# Patient Record
Sex: Female | Born: 1970 | Race: White | Hispanic: No | Marital: Single | Smoking: Current every day smoker
Health system: Southern US, Community
[De-identification: ages and names within clinical notes are randomized; demographics above are authoritative.]

## PROBLEM LIST (undated history)

## (undated) DIAGNOSIS — F410 Panic disorder [episodic paroxysmal anxiety] without agoraphobia: Secondary | ICD-10-CM

## (undated) DIAGNOSIS — B9689 Other specified bacterial agents as the cause of diseases classified elsewhere: Secondary | ICD-10-CM

## (undated) DIAGNOSIS — F988 Other specified behavioral and emotional disorders with onset usually occurring in childhood and adolescence: Secondary | ICD-10-CM

## (undated) DIAGNOSIS — N760 Acute vaginitis: Secondary | ICD-10-CM

---

## 1999-11-30 ENCOUNTER — Other Ambulatory Visit: Admission: RE | Admit: 1999-11-30 | Discharge: 1999-11-30 | Payer: Self-pay

## 2014-05-05 ENCOUNTER — Encounter: Payer: Self-pay | Admitting: Emergency Medicine

## 2014-05-05 ENCOUNTER — Emergency Department
Admission: EM | Admit: 2014-05-05 | Discharge: 2014-05-05 | Disposition: A | Discharge status: Home / Self Care | Payer: Medicaid Other | Source: Home / Self Care | Attending: Family Medicine | Admitting: Family Medicine

## 2014-05-05 ENCOUNTER — Emergency Department (INDEPENDENT_AMBULATORY_CARE_PROVIDER_SITE_OTHER): Payer: Medicaid Other

## 2014-05-05 DIAGNOSIS — R0602 Shortness of breath: Secondary | ICD-10-CM

## 2014-05-05 DIAGNOSIS — R059 Cough, unspecified: Secondary | ICD-10-CM

## 2014-05-05 DIAGNOSIS — J209 Acute bronchitis, unspecified: Secondary | ICD-10-CM

## 2014-05-05 DIAGNOSIS — R05 Cough: Secondary | ICD-10-CM

## 2014-05-05 HISTORY — DX: Other specified behavioral and emotional disorders with onset usually occurring in childhood and adolescence: F98.8

## 2014-05-05 HISTORY — DX: Other specified bacterial agents as the cause of diseases classified elsewhere: B96.89

## 2014-05-05 HISTORY — DX: Panic disorder (episodic paroxysmal anxiety): F41.0

## 2014-05-05 HISTORY — DX: Other specified bacterial agents as the cause of diseases classified elsewhere: N76.0

## 2014-05-05 MED ORDER — ALBUTEROL SULFATE HFA 108 (90 BASE) MCG/ACT IN AERS: 2.0000 | INHALATION_SPRAY | RESPIRATORY_TRACT | Status: DC | PRN

## 2014-05-05 MED ORDER — DOXYCYCLINE HYCLATE 100 MG PO CAPS: 100.0000 mg | ORAL_CAPSULE | Freq: Two times a day (BID) | ORAL | Status: DC

## 2014-05-05 MED ORDER — PREDNISONE 20 MG PO TABS: 20.0000 mg | ORAL_TABLET | Freq: Two times a day (BID) | ORAL | Status: DC

## 2014-05-05 MED ORDER — GUAIFENESIN-CODEINE 100-10 MG/5ML PO SOLN: ORAL | Status: DC

## 2014-05-05 NOTE — ED Provider Notes (Signed)
CSN: [id number]     Arrival date & time 05/05/14  1336 History   First MD Initiated Contact with Patient 05/05/14 1357     Chief Complaint  Patient presents with  . Nasal Congestion      HPI Comments: Patient developed cold-like symptoms one week ago with nasal congestion, sore throat, and fatigue.  Three to four days ago she developed a non-productive cough with burning in her anterior chest, wheezing, and shortness of breath. She has a history of pneumonia. She continues to smoke.  The history is provided by the patient.    Past Medical History  Diagnosis Date  . ADD (attention deficit disorder)   . Panic attack   . Bacterial vaginosis    Past Surgical History  Procedure Laterality Date  . Cesarean section     History reviewed. No pertinent family history. History  Substance Use Topics  . Smoking status: Current Every Day Smoker -- 1.00 packs/day    Types: Cigarettes  . Smokeless tobacco: Never Used  . Alcohol Use: 0.5 oz/week    1 drink(s) per week     Comment: once a month   OB History   Grav Para Term Preterm Abortions TAB SAB Ect Mult Living                 Review of Systems + sore throat + hoarseness + cough No pleuritic pain but has burning in anterior chest + wheezing + nasal congestion + post-nasal drainage No sinus pain/pressure No itchy/red eyes No earache No hemoptysis No SOB No fever, + chills/sweats + nausea No vomiting No abdominal pain + diarrhea, resolved No urinary symptoms No skin rash + fatigue + myalgias + headache Used OTC meds without relief  Allergies  Zithromax and Penicillins  Home Medications   Prior to Admission medications   Medication Sig Start Date End Date Taking? Authorizing Provider  albuterol (PROVENTIL HFA;VENTOLIN HFA) 108 (90 BASE) MCG/ACT inhaler Inhale 2 puffs into the lungs every 4 (four) hours as needed for wheezing or shortness of breath. 05/05/14   Lattie Haw, MD  doxycycline (VIBRAMYCIN) 100 MG  capsule Take 1 capsule (100 mg total) by mouth 2 (two) times daily. Take with food 05/05/14   Lattie Haw, MD  guaiFENesin-codeine 100-10 MG/5ML syrup Take 10mL by mouth at bedtime as needed for cough 05/05/14   Lattie Haw, MD  predniSONE (DELTASONE) 20 MG tablet Take 1 tablet (20 mg total) by mouth 2 (two) times daily. Take with food. 05/05/14   Lattie Haw, MD   BP 144/91  Pulse 59  Temp(Src) 98.9 F (37.2 C) (Oral)  Resp 16  Ht  (1.702 m)  Wt 139 lb (63.05 kg)  BMI 21.77 kg/m2  SpO2 99%  LMP 05/05/2014 Physical Exam Nursing notes and Vital Signs reviewed. Appearance:  Patient appears healthy, stated age, and in no acute distress Eyes:  Pupils are equal, round, and reactive to light and accomodation.  Extraocular movement is intact.  Conjunctivae are not inflamed  Ears:  Canals normal.  Tympanic membranes normal.  Nose:  Mildly congested turbinates.  No sinus tenderness.    Pharynx:  Normal Neck:  Supple.   Enlarged tender posterior nodes are palpated bilaterally  Lungs:   Bilateral anterior wheezes.  Breath sounds are equal.  Heart:  Regular rate and rhythm without murmurs, rubs, or gallops.  Abdomen:  Nontender without masses or hepatosplenomegaly.  Bowel sounds are present.  No CVA or flank tenderness.  Extremities:  No edema.  No calf tenderness Skin:  No rash present.   ED Course  Procedures  none    Imaging Review Dg Chest 2 View  05/05/2014   CLINICAL DATA:  Cough.  Shortness of breath.  Chest discomfort.  EXAM: CHEST  2 VIEW  COMPARISON:  None.  FINDINGS: The heart size and mediastinal contours are within normal limits. Both lungs are clear. Pulmonary hyperinflation is noted. No evidence of pneumothorax or pleural effusion. The visualized skeletal structures are unremarkable.  IMPRESSION: Pulmonary hyperinflation, suspicious for asthma or COPD. No active lung disease.   Electronically Signed   By: Myles Rosenthal M.D.   On: 05/05/2014 14:52     MDM   1. Acute  bronchitis, unspecified organism    Begin doxycycline and prednisone burst.  Robitussin AC at bedtime.  Rx for albuterol inhaler. Take plain Mucinex (1200 mg guaifenesin) twice daily for cough and congestion.  May add Sudafed for sinus congestion.   Increase fluid intake, rest. May use Afrin nasal spray (or generic oxymetazoline) twice daily for about 5 days.  Also recommend using saline nasal spray several times daily and saline nasal irrigation (AYR is a common brand) Try warm salt water gargles for sore throat.  Stop all antihistamines for now, and other non-prescription cough/cold preparations. Follow-up with family doctor if not improving 7 to 10 days.     Lattie Haw, MD 05/10/14 1058

## 2014-05-05 NOTE — ED Notes (Signed)
C/o of sinus and chest congestion x 1 week with fever and body aches.

## 2014-05-05 NOTE — Discharge Instructions (Signed)
Take plain Mucinex (1200 mg guaifenesin) twice daily for cough and congestion.  May add Sudafed for sinus congestion.   Increase fluid intake, rest. °May use Afrin nasal spray (or generic oxymetazoline) twice daily for about 5 days.  Also recommend using saline nasal spray several times daily and saline nasal irrigation (AYR is a common brand) °Try warm salt water gargles for sore throat.  °Stop all antihistamines for now, and other non-prescription cough/cold preparations. °Follow-up with family doctor if not improving 7 to 10 days.  °

## 2015-09-28 DIAGNOSIS — F1021 Alcohol dependence, in remission: Secondary | ICD-10-CM | POA: Insufficient documentation

## 2016-01-04 LAB — HM PAP SMEAR: HM Pap smear: NEGATIVE

## 2016-07-02 ENCOUNTER — Encounter: Payer: Self-pay | Admitting: Osteopathic Medicine

## 2016-07-02 ENCOUNTER — Ambulatory Visit (INDEPENDENT_AMBULATORY_CARE_PROVIDER_SITE_OTHER): Payer: Self-pay | Admitting: Osteopathic Medicine

## 2016-07-02 VITALS — BP 122/76 | HR 99 | Ht 67.0 in | Wt 152.0 lb

## 2016-07-02 DIAGNOSIS — B9689 Other specified bacterial agents as the cause of diseases classified elsewhere: Secondary | ICD-10-CM

## 2016-07-02 DIAGNOSIS — J4520 Mild intermittent asthma, uncomplicated: Secondary | ICD-10-CM

## 2016-07-02 DIAGNOSIS — J329 Chronic sinusitis, unspecified: Secondary | ICD-10-CM

## 2016-07-02 DIAGNOSIS — Z9189 Other specified personal risk factors, not elsewhere classified: Secondary | ICD-10-CM

## 2016-07-02 DIAGNOSIS — F988 Other specified behavioral and emotional disorders with onset usually occurring in childhood and adolescence: Secondary | ICD-10-CM

## 2016-07-02 DIAGNOSIS — F419 Anxiety disorder, unspecified: Secondary | ICD-10-CM | POA: Insufficient documentation

## 2016-07-02 MED ORDER — CEFDINIR 300 MG PO CAPS: 300.0000 mg | ORAL_CAPSULE | Freq: Two times a day (BID) | ORAL | 0 refills | Status: DC

## 2016-07-02 MED ORDER — AMPHETAMINE-DEXTROAMPHETAMINE 20 MG PO TABS: 20.0000 mg | ORAL_TABLET | Freq: Two times a day (BID) | ORAL | 0 refills | Status: DC

## 2016-07-02 MED ORDER — ALBUTEROL SULFATE HFA 108 (90 BASE) MCG/ACT IN AERS: 2.0000 | INHALATION_SPRAY | RESPIRATORY_TRACT | 0 refills | Status: DC | PRN

## 2016-07-02 NOTE — Progress Notes (Signed)
HPI: Sabrina HowellsMisty T Dearman is a 45 y.o. female  who [facility] [place] today, 07/02/16,  for chief complaint of:  Chief Complaint  Patient presents with  . Establish Care    Discuss ADHD medication, sinus infection    ADHD: Previous CT [place] physician in [place], states she underwent full evaluation. No records available for this right now. Had been going to [place] [facility] but providers they're Changing and she was unhappy with her last doctor. Patient here to establish care, I see her mom. Patient states she functions much better on the ADHD medication, keeps her focus, reduces panic attacks. She also notes it helps her to stay sober, she admits to some problems with alcohol use. Previous notes from another doctor she saw most recently indicate same. Patient works as a Designer, television/film setserver/bartender. She has been active with AA in the past, would like to get seen by psychiatry formal rehabilitation by lack of insurance permits this for her.  Sinuses: Patient has been sick with sinus infection for about 3 weeks now, pain worse on the left side, some intermittent cough, patient is also smoker. Over-the-counter medications about been helpful. Patient also requests refill on albuterol  Patient is accompanied by mom who assists with history-taking.   Past medical, surgical, social and family history reviewed: Past Medical History:  Diagnosis Date  . ADD (attention deficit disorder)   . Bacterial vaginosis   . Panic attack    Past Surgical History:  Procedure Laterality Date  . CESAREAN SECTION     Social History  Substance Use Topics  . Smoking status: Current Every Day Smoker    Packs/day: 1.00    Types: Cigarettes  . Smokeless tobacco: Never Used  . Alcohol use 0.5 oz/week    1 Standard drinks or equivalent per week     Comment: once a month   Family History  Problem Relation Age of Onset  . Cancer Mother   . Hypertension Mother    . Alcohol abuse Father      Current medication list and allergy/intolerance information reviewed:   Current Outpatient Prescriptions on File Prior to Visit  Medication Sig Dispense Refill  . albuterol (PROVENTIL HFA;VENTOLIN HFA) 108 (90 BASE) MCG/ACT inhaler Inhale 2 puffs into the lungs every 4 (four) hours as needed for wheezing or shortness of breath. 1 Inhaler 0   No current facility-administered medications on file prior to visit.    Allergies  Allergen Reactions  . Zithromax [Azithromycin] Other (See Comments)    Patient reports having hallucinations and being seen in the ED.  [name]. Penicillins Other (See Comments)    Patient does not recall reactions as she has not had PCN since she was a child.      Review of Systems:  Constitutional: No recent illness  HEENT: +Sinus headache, no vision change  Cardiac: No  chest pain, No  pressure, No palpitations  Respiratory:  No  shortness of breath. +Cough  Gastrointestinal: No  abdominal pain, no change on bowel habits  Musculoskeletal: No new myalgia/arthralgia  Skin: No  Rash  Hem/Onc: No  easy bruising/bleeding, No  abnormal lumps/bumps  Neurologic: No  weakness, No  Dizziness  Psychiatric: No  concerns with depression, No  concerns with anxiety   Depression screen [facility] 07/02/2016  Decreased Interest 0  Down, Depressed, Hopeless 0  PHQ - 2 Score 0    Exam:  BP 122/76   Pulse 99   Ht 5'  7" (1.702 m)   Wt 152 lb (68.9 kg)   BMI 23.81 kg/m   Constitutional: VS see above. General Appearance: alert, well-developed, well-nourished, NAD  Eyes: Normal lids and conjunctive, non-icteric sclera  Ears, Nose, Mouth, Throat: MMM, Normal external inspection ears/nares/mouth/lips/gums.  Neck: No masses, trachea midline.   Respiratory: Normal respiratory effort. no wheeze, no rhonchi, no rales  Cardiovascular: S1/S2 normal, no murmur, no rub/gallop auscultated. RRR.   Musculoskeletal: Gait normal. Symmetric and  independent movement of all extremities  Neurological: Normal balance/coordination. No tremor.  Skin: warm, dry, intact.   Psychiatric: Normal judgment/insight. Normal mood and affect. Oriented x3.       ASSESSMENT/PLAN: Concern about alcohol use, patient seen to have good insight into this problem, continue better once ADHD/anxiety is managed appropriately. Reviewed records, patient was also hospitalized for unintentional overdose of prescription medications/alcohol in January 2017. 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 I'm okay to continue Adderall for now pending record review and urine drug screen, patient to follow-up in 2 weeks for further discussion.  Attention deficit disorder (ADD) in adult - Plan: amphetamine-dextroamphetamine (ADDERALL) 20 MG tablet, Drug Abuse Panel 10-50 No Conf, U  Mild intermittent reactive airway disease without complication - Plan: albuterol (PROVENTIL HFA;VENTOLIN HFA) 108 (90 Base) MCG/ACT inhaler  Bacterial sinusitis - Plan: cefdinir (OMNICEF) 300 MG capsule    Patient Instructions  PLAN: For ADHD: For initiation and maintenance of controlled substances, our clinic policy requires urine drug screen be done TODAY, and controlled substance contract signed and copy given to you. Will review records as well - please call 1 week prior to your next appointment to ensure we have received and reviewed these. Continuation of controlled substance prescriptions for attention deficit disorder will be continued at the discretion of your provider.  For sinus infection: antibiotics x1 week, let me know if no improvement     Visit summary with medication list and pertinent instructions was printed for patient to review. All questions at time of visit were answered - patient instructed to contact office with any additional concerns. ER/RTC precautions were reviewed with the patient. Follow-up plan:  Return in about 2 weeks (around 07/16/2016), or if symptoms worsen or fail to improve, for ADHD FOLLOW-UP - DRUG SCREEN REVIEW, RECORDS REVIEW .

## 2016-07-02 NOTE — Patient Instructions (Signed)
PLAN: For ADHD: For initiation and maintenance of controlled substances, our clinic policy requires urine drug screen be done TODAY, and controlled substance contract signed and copy given to you. Will review records as well - please call 1 week prior to your next appointment to ensure we have received and reviewed these. Continuation of controlled substance prescriptions for attention deficit disorder will be continued at the discretion of your provider.  For sinus infection: antibiotics x1 week, let me know if no improvement

## 2016-07-05 LAB — DRUG ABUSE PANEL 10-50 NO CONF, U
AMPHETAMINES (1000 ng/mL SCRN): POSITIVE — AB
BARBITURATES: NEGATIVE
BENZODIAZEPINES: NEGATIVE
COCAINE METABOLITES: NEGATIVE
MARIJUANA MET (50 NG/ML SCRN): NEGATIVE
METHADONE: NEGATIVE
METHAQUALONE: NEGATIVE
OPIATES: NEGATIVE
PHENCYCLIDINE: NEGATIVE
PROPOXYPHENE: NEGATIVE

## 2016-07-16 ENCOUNTER — Encounter: Payer: Self-pay | Admitting: Osteopathic Medicine

## 2016-07-16 ENCOUNTER — Ambulatory Visit (INDEPENDENT_AMBULATORY_CARE_PROVIDER_SITE_OTHER): Payer: Self-pay | Admitting: Osteopathic Medicine

## 2016-07-16 VITALS — BP 92/54 | HR 105 | Ht 67.0 in | Wt 151.0 lb

## 2016-07-16 DIAGNOSIS — F988 Other specified behavioral and emotional disorders with onset usually occurring in childhood and adolescence: Secondary | ICD-10-CM

## 2016-07-16 DIAGNOSIS — J329 Chronic sinusitis, unspecified: Secondary | ICD-10-CM

## 2016-07-16 DIAGNOSIS — R062 Wheezing: Secondary | ICD-10-CM

## 2016-07-16 DIAGNOSIS — B9689 Other specified bacterial agents as the cause of diseases classified elsewhere: Secondary | ICD-10-CM

## 2016-07-16 MED ORDER — ALBUTEROL SULFATE (2.5 MG/3ML) 0.083% IN NEBU: 2.5000 mg | INHALATION_SOLUTION | RESPIRATORY_TRACT | 12 refills | Status: DC | PRN

## 2016-07-16 MED ORDER — AMPHETAMINE-DEXTROAMPHETAMINE 20 MG PO TABS: 20.0000 mg | ORAL_TABLET | Freq: Two times a day (BID) | ORAL | 0 refills | Status: DC

## 2016-07-16 NOTE — Progress Notes (Signed)
HPI: Sabrina Barrera is a 45 y.o. female  who [facility] today, 07/16/16,  for chief complaint of:  Chief Complaint  Patient presents with  . Follow-up    ADD    Attention deficit: Recent UDS (+)for prescribed amphetamines.  No records available for review. Patient would like to continue current dose of medicine, is helping her function better at work, no complaints of palpitations/dramatic weight changes.  Sinus/respiratory: Treated at last visit for bacterial sinusitis, patient was unable to get the prescriptions filled, we opted for Omnicef due to penicillin allergy. Patient states this medication was USD80. Still having symptoms with occasional wheezing issues, sinus pressure    Past medical, surgical, social and family history reviewed: Patient Active Problem List   Diagnosis Date Noted  . ADD (attention deficit disorder) 07/02/2016  . Anxiety 07/02/2016  . History of drug overdose 07/02/2016  . History of alcoholism (HCC) 09/28/2015   Past Surgical History:  Procedure Laterality Date  . CESAREAN SECTION     Social History  Substance Use Topics  . Smoking status: Current Every Day Smoker    Packs/day: 1.00    Types: Cigarettes  . Smokeless tobacco: Never Used  . Alcohol use 0.5 oz/week    1 Standard drinks or equivalent per week     Comment: once a month   Family History  Problem Relation Age of Onset  . Cancer Mother   . Hypertension Mother   . Alcohol abuse Father      Current medication list and allergy/intolerance information reviewed:   Current Outpatient Prescriptions on File Prior to Visit  Medication Sig Dispense Refill  . albuterol (PROVENTIL HFA;VENTOLIN HFA) 108 (90 Base) MCG/ACT inhaler Inhale 2 puffs into the lungs every 4 (four) hours as needed for wheezing or shortness of breath. 1 Inhaler 0  . amphetamine-dextroamphetamine (ADDERALL) 20 MG tablet Take 1 tablet (20 mg total) by mouth 2 (two) times  daily. 28 tablet 0  . cefdinir (OMNICEF) 300 MG capsule Take 1 capsule (300 mg total) by mouth 2 (two) times daily. (Patient not taking: Reported on 07/16/2016) 14 capsule 0   No current facility-administered medications on file prior to visit.    Allergies  Allergen Reactions  . Zithromax [Azithromycin] Other (See Comments)    Patient reports having hallucinations and being seen in the ED.  [name]. Penicillins Other (See Comments)    Patient does not recall reactions as she has not had PCN since she was a child.      Review of Systems:  Constitutional: No recent illness  HEENT: No  headache, no vision change  Cardiac: No  chest pain, No  pressure, No palpitations  Respiratory:  No  shortness of breath.   Psychiatric: No  concerns with depression, +concerns with anxiety  Exam:  BP (!) 92/54   Pulse (!) 105   Ht 5\' 7"  (1.702 m)   Wt 151 lb (68.5 kg)   BMI 23.65 kg/m   Constitutional: VS see above. General Appearance: alert, well-developed, well-nourished, NAD  Eyes: Normal lids and conjunctive, non-icteric sclera  Ears, Nose, Mouth, Throat: MMM, Normal external inspection ears/nares/mouth/lips/gums.  Neck: No masses, trachea midline.   Respiratory: Normal respiratory effort. no wheeze, no rhonchi, no rales  Cardiovascular: S1/S2 normal, no murmur, no rub/gallop auscultated. RRR.   Musculoskeletal: Gait normal. Symmetric and independent movement of all extremities  Neurological: Normal balance/coordination. No tremor.  Skin: warm, dry, intact.   Psychiatric: Normal judgment/insight. Normal  mood and affect. Oriented x3.     ASSESSMENT/PLAN:   Good Rx card given for medications, particularly albuterol and antibiotic as well as ADHD treatment  Treatment supply of medications provided, contract is in place.  Attention deficit disorder (ADD) in adult - Plan: amphetamine-dextroamphetamine (ADDERALL) 20 MG tablet, DISCONTINUED: amphetamine-dextroamphetamine (ADDERALL)  20 MG tablet, DISCONTINUED: amphetamine-dextroamphetamine (ADDERALL) 20 MG tablet  Bacterial sinusitis  Wheezing - Plan: albuterol (PROVENTIL) (2.5 MG/3ML) 0.083% nebulizer solution      Visit summary with medication list and pertinent instructions was printed for patient to review. All questions at time of visit were answered - patient instructed to contact office with any additional concerns. ER/RTC precautions were reviewed with the patient. Follow-up plan: Return in about 3 months (around 10/16/2016) for ADHD FOLLOWUP.

## 2016-10-05 ENCOUNTER — Telehealth: Payer: Self-pay | Admitting: Osteopathic Medicine

## 2016-10-05 NOTE — Telephone Encounter (Signed)
Called and left voicemail for patient to see if they have had or would like to have their flu vaccination. - CF

## 2016-10-16 ENCOUNTER — Ambulatory Visit: Payer: Self-pay | Admitting: Osteopathic Medicine

## 2016-10-26 ENCOUNTER — Encounter: Payer: Self-pay | Admitting: Osteopathic Medicine

## 2016-10-26 ENCOUNTER — Ambulatory Visit (INDEPENDENT_AMBULATORY_CARE_PROVIDER_SITE_OTHER): Payer: Self-pay | Admitting: Osteopathic Medicine

## 2016-10-26 VITALS — BP 137/86 | HR 90 | Ht 68.0 in | Wt 156.0 lb

## 2016-10-26 DIAGNOSIS — Z0000 Encounter for general adult medical examination without abnormal findings: Secondary | ICD-10-CM

## 2016-10-26 DIAGNOSIS — F988 Other specified behavioral and emotional disorders with onset usually occurring in childhood and adolescence: Secondary | ICD-10-CM

## 2016-10-26 MED ORDER — AMPHETAMINE-DEXTROAMPHETAMINE 20 MG PO TABS: 20.0000 mg | ORAL_TABLET | Freq: Two times a day (BID) | ORAL | 0 refills | Status: DC

## 2016-10-26 NOTE — Progress Notes (Signed)
HPI: Sabrina Barrera is a 46 y.o. female  who presents to [facility] today, 10/26/16,  for chief complaint of:  Chief Complaint  Patient presents with  . Follow-up    Attention deficit: Recent UDS (+)for prescribed amphetamines, negative for illicit drugs or non-prescribed medications.  Patient would like to continue current dose of medicine, is helping her function better at work, no complaints of palpitations/dramatic weight loss.     Past medical, surgical, social and family history reviewed: Patient Active Problem List   Diagnosis Date Noted  . ADD (attention deficit disorder) 07/02/2016  . Anxiety 07/02/2016  . History of drug overdose 07/02/2016  . History of alcoholism (HCC) 09/28/2015   Past Surgical History:  Procedure Laterality Date  . CESAREAN SECTION     Social History  Substance Use Topics  . Smoking status: Current Every Day Smoker    Packs/day: 1.00    Types: Cigarettes  . Smokeless tobacco: Never Used  . Alcohol use 0.5 oz/week    1 Standard drinks or equivalent per week     Comment: once a month   Family History  Problem Relation Age of Onset  . Cancer Mother   . Hypertension Mother   . Alcohol abuse Father      Current medication list and allergy/intolerance information reviewed:   Current Outpatient Prescriptions on File Prior to Visit  Medication Sig Dispense Refill  . albuterol (PROVENTIL HFA;VENTOLIN HFA) 108 (90 Base) MCG/ACT inhaler Inhale 2 puffs into the lungs every 4 (four) hours as needed for wheezing or shortness of breath. 1 Inhaler 0  . albuterol (PROVENTIL) (2.5 MG/3ML) 0.083% nebulizer solution Take 3 mLs (2.5 mg total) by nebulization every 4 (four) hours as needed for wheezing or shortness of breath. 25 vial 12  . amphetamine-dextroamphetamine (ADDERALL) 20 MG tablet Take 1 tablet (20 mg total) by mouth 2 (two) times daily. 60 tablet 0  . cefdinir (OMNICEF) 300 MG capsule Take 1 capsule (300 mg  total) by mouth 2 (two) times daily. (Patient not taking: Reported on 07/16/2016) 14 capsule 0   No current facility-administered medications on file prior to visit.    Allergies  Allergen Reactions  . Zithromax [Azithromycin] Other (See Comments)    Patient reports having hallucinations and being seen in the ED.  [name] Penicillins Other (See Comments)    Patient does not recall reactions as she has not had PCN since she was a child.      Review of Systems:  Constitutional: +recent illness w/ flu but better now  HEENT: No  headache, no vision change  Cardiac: No  chest pain, No  pressure, No palpitations  Respiratory:  No  shortness of breath.    Exam:  BP 137/86   Pulse 90   Ht 5\' 8"  (1.727 m)   Wt 156 lb (70.8 kg)   BMI 23.72 kg/m   Constitutional: VS see above. General Appearance: alert, well-developed, well-nourished, NAD  Eyes: Normal lids and conjunctive, non-icteric sclera  Ears, Nose, Mouth, Throat: MMM, Normal external inspection ears/nares/mouth/lips/gums.  Neck: No masses, trachea midline.   Respiratory: Normal respiratory effort. no wheeze, no rhonchi, no rales  Cardiovascular: S1/S2 normal, no murmur, no rub/gallop auscultated. RRR.   Musculoskeletal: Gait normal. Symmetric and independent movement of all extremities  Neurological: Normal balance/coordination. No tremor.  Skin: warm, dry, intact.   Psychiatric: Normal judgment/insight. Normal mood and affect. Oriented x3.     ASSESSMENT/PLAN:   3 mos supply Rx  provided, contract is in place.  Labs ordered ahead of time for annual but this was not done or billed today   Attention deficit disorder (ADD) in adult - Plan: amphetamine-dextroamphetamine (ADDERALL) 20 MG tablet, DISCONTINUED: amphetamine-dextroamphetamine (ADDERALL) 20 MG tablet, DISCONTINUED: amphetamine-dextroamphetamine (ADDERALL) 20 MG tablet  Annual physical exam - Plan: CBC with Differential/Platelet, COMPLETE METABOLIC PANEL WITH  GFR, Lipid panel, TSH, VITAMIN D 25 Hydroxy (Vit-D Deficiency, Fractures)      Visit summary with medication list and pertinent instructions was printed for patient to review. All questions at time of visit were answered - patient instructed to contact office with any additional concerns. ER/RTC precautions were reviewed with the patient. Follow-up plan: Return in about 3 months (around 01/25/2017) [facility] ADHD MEDICATION REFILL .

## 2017-01-25 ENCOUNTER — Ambulatory Visit (INDEPENDENT_AMBULATORY_CARE_PROVIDER_SITE_OTHER): Payer: Self-pay | Admitting: Osteopathic Medicine

## 2017-01-25 ENCOUNTER — Encounter: Payer: Self-pay | Admitting: Osteopathic Medicine

## 2017-01-25 VITALS — BP 112/80 | HR 90 | Ht 67.0 in | Wt 146.0 lb

## 2017-01-25 DIAGNOSIS — Z0000 Encounter for general adult medical examination without abnormal findings: Secondary | ICD-10-CM

## 2017-01-25 DIAGNOSIS — F988 Other specified behavioral and emotional disorders with onset usually occurring in childhood and adolescence: Secondary | ICD-10-CM

## 2017-01-25 DIAGNOSIS — N760 Acute vaginitis: Secondary | ICD-10-CM

## 2017-01-25 MED ORDER — AMPHETAMINE-DEXTROAMPHETAMINE 20 MG PO TABS: 20.0000 mg | ORAL_TABLET | Freq: Two times a day (BID) | ORAL | 0 refills | Status: DC

## 2017-01-25 MED ORDER — BORIC ACID CRYS: CRYSTALS | 1 refills | Status: DC

## 2017-01-25 MED ORDER — FLUCONAZOLE 150 MG PO TABS: 150.0000 mg | ORAL_TABLET | Freq: Once | ORAL | 1 refills | Status: AC

## 2017-01-25 NOTE — Progress Notes (Signed)
HPI: Sabrina Barrera is a 46 y.o. female  who presents to [facility] [place] today, 01/25/17,  for chief complaint of:  Chief Complaint  Patient presents with  . Annual Exam     Patient here for annual physical / wellness exam.  See preventive care reviewed as below.  Recent labs reviewed in detail with the patient.   Additional concerns today include:  ADHD: doing well on current medication regimen.     Past medical, surgical, social and family history reviewed: Patient Active Problem List   Diagnosis Date Noted  . ADD (attention deficit disorder) 07/02/2016  . Anxiety 07/02/2016  . History of drug overdose 07/02/2016  . History of alcoholism (HCC) 09/28/2015   Past Surgical History:  Procedure Laterality Date  . CESAREAN SECTION     Social History  Substance Use Topics  . Smoking status: Current Every Day Smoker    Packs/day: 1.00    Types: Cigarettes  . Smokeless tobacco: Never Used  . Alcohol use 0.5 oz/week    1 Standard drinks or equivalent per week     Comment: once a month   Family History  Problem Relation Age of Onset  . Cancer Mother   . Hypertension Mother   . Alcohol abuse Father      Current medication list and allergy/intolerance information reviewed:   Current Outpatient Prescriptions  Medication Sig Dispense Refill  . albuterol (PROVENTIL HFA;VENTOLIN HFA) 108 (90 Base) MCG/ACT inhaler Inhale 2 puffs into the lungs every 4 (four) hours as needed for wheezing or shortness of breath. 1 Inhaler 0  . albuterol (PROVENTIL) (2.5 MG/3ML) 0.083% nebulizer solution Take 3 mLs (2.5 mg total) by nebulization every 4 (four) hours as needed for wheezing or shortness of breath. 25 vial 12  . amphetamine-dextroamphetamine (ADDERALL) 20 MG tablet Take 1 tablet (20 mg total) by mouth 2 (two) times daily. 60 tablet 0   No current facility-administered medications for this visit.    Allergies  Allergen Reactions  . Zithromax  [Azithromycin] Other (See Comments)    Patient reports having hallucinations and being seen in the ED.  [name] Penicillins Other (See Comments)    Patient does not recall reactions as she has not had PCN since she was a child.      Review of Systems:  Constitutional:  No  fever, no chills, No recent illness, No unintentional weight changes. No significant fatigue.   HEENT: No  headache, no vision change  Cardiac: No  chest pain, No  pressure  Respiratory:  No  shortness of breath. No  Cough  Gastrointestinal: No  abdominal pain, No  nausea   Musculoskeletal: No new myalgia/arthralgia  Skin: No  Rash, No other wounds/concerning lesions  Hem/Onc: No  easy bruising/bleeding,   Endocrine: No cold intolerance,  No heat intolerance. No polyuria/polydipsia/polyphagia   GU: vaginal discharge, Hx BV  Neurologic: No  weakness, No  dizziness, No  slurred speech/focal weakness/facial droop  Psychiatric: No  concerns with depression, No  concerns with anxiety, No sleep problems, No mood problems  Exam:  BP 112/80   Pulse 90   Ht 5\' 7"  (1.702 m)   Wt 146 lb (66.2 kg)   BMI 22.87 kg/m   Constitutional: VS see above. General Appearance: alert, well-developed, well-nourished, NAD  Eyes: Normal lids and conjunctive, non-icteric sclera  Ears, Nose, Mouth, Throat: MMM, Normal external inspection ears/nares/mouth/lips/gums.   Neck: No masses, trachea midline. No thyroid enlargement. No tenderness/mass appreciated. No lymphadenopathy  Respiratory: Normal respiratory effort. no wheeze, no rhonchi, no rales  Cardiovascular: S1/S2 normal, no murmur, no rub/gallop auscultated. RRR. No lower extremity edema.  Gastrointestinal: Nontender, no masses. No hepatomegaly, no splenomegaly. No hernia appreciated. Bowel sounds normal. Rectal exam deferred.   Musculoskeletal: Gait normal. No clubbing/cyanosis of digits.   Neurological: Normal balance/coordination. No tremor.  Skin: warm, dry, intact.  No rash/ulcer. No concerning nevi or subq nodules on limited exam.    Psychiatric: Normal judgment/insight. Normal mood and affect. Oriented x3.    ASSESSMENT/PLAN:   Annual physical exam  Attention deficit disorder (ADD) in adult - Plan: amphetamine-dextroamphetamine (ADDERALL) 20 MG tablet, DISCONTINUED: amphetamine-dextroamphetamine (ADDERALL) 20 MG tablet, DISCONTINUED: amphetamine-dextroamphetamine (ADDERALL) 20 MG tablet  Vaginosis - Plan: Boric Acid CRYS, fluconazole (DIFLUCAN) 150 MG tablet   FEMALE PREVENTIVE CARE Updated 01/25/17   ANNUAL SCREENING/COUNSELING  Diet/Exercise - HEALTHY HABITS DISCUSSED TO DECREASE CV RISK History  Smoking Status  . Current Every Day Smoker  . Packs/day: 1.00  . Types: Cigarettes  Smokeless Tobacco  . Never Used   History  Alcohol Use  . 0.5 oz/week  . 1 Standard drinks or equivalent per week    Comment: once a month   [facility]  Decreased Interest 0  Down, Depressed, Hopeless 0  PHQ - 2 Score 0    Domestic violence concerns - no  HTN SCREENING - SEE VITALS  SEXUAL HEALTH  Sexually active in the past year - No  Need/want STI testing today? - no  Concerns about libido or pain with sex? - no  Plans for pregnancy? - none  INFECTIOUS DISEASE SCREENING  HIV - does not need - declined  GC/CT - does not need  HepC - DOB [birth year] - does not need  TB - does not need  DISEASE SCREENING  Lipid - needs  DM2 - needs  Osteoporosis - women age 79+ - does not need  CANCER SCREENING  Cervical - does not need - Pap normal 12/2015  Breast - needs  Lung - does not need  Colon - does not need  ADULT VACCINATION  Influenza - annual vaccine recommended  Td - booster every 10 years   Zoster - option at 50, yes at 60+   PCV13 - was not indicated  PPSV23 - was not indicated  There is no immunization history on file for this patient.     Visit summary with medication list and  pertinent instructions was printed for patient to review. All questions at time of visit were answered - patient instructed to contact office with any additional concerns. ER/RTC precautions were reviewed with the patient. Follow-up plan: Return in about 3 months (around 04/27/2017) for ADHD MEDICATION REFILL. Get labs done prior to next visit

## 2017-03-27 ENCOUNTER — Ambulatory Visit: Payer: Self-pay | Admitting: Osteopathic Medicine

## 2017-03-27 DIAGNOSIS — Z0189 Encounter for other specified special examinations: Secondary | ICD-10-CM

## 2017-04-01 ENCOUNTER — Ambulatory Visit: Payer: Self-pay | Admitting: Osteopathic Medicine

## 2017-04-01 DIAGNOSIS — Z0189 Encounter for other specified special examinations: Secondary | ICD-10-CM

## 2017-04-19 ENCOUNTER — Ambulatory Visit: Payer: Self-pay

## 2017-05-08 ENCOUNTER — Ambulatory Visit: Payer: Self-pay | Admitting: Osteopathic Medicine

## 2017-05-08 DIAGNOSIS — Z0189 Encounter for other specified special examinations: Secondary | ICD-10-CM

## 2017-06-03 ENCOUNTER — Telehealth: Payer: Self-pay | Admitting: Osteopathic Medicine

## 2017-06-03 DIAGNOSIS — F988 Other specified behavioral and emotional disorders with onset usually occurring in childhood and adolescence: Secondary | ICD-10-CM

## 2017-06-03 MED ORDER — AMPHETAMINE-DEXTROAMPHETAMINE 20 MG PO TABS: 20.0000 mg | ORAL_TABLET | Freq: Two times a day (BID) | ORAL | 0 refills | Status: DC

## 2017-06-03 NOTE — Telephone Encounter (Signed)
Patient's mother called request to get a refill for Adderall scheduled appt for 06/10/17 when Dorene Grebe returns pt's mom request to call her number when ready to pick up [phone]

## 2017-06-03 NOTE — Telephone Encounter (Signed)
Prescription available for pick-up Patient needs to keep her follow-up appointment with Dorene Grebe

## 2017-06-04 NOTE — Telephone Encounter (Signed)
Patient has appointment for 06/10/2017. Rhonda Cunningham,CMA

## 2017-06-10 ENCOUNTER — Ambulatory Visit: Payer: Self-pay | Admitting: Osteopathic Medicine

## 2017-06-10 DIAGNOSIS — Z0189 Encounter for other specified special examinations: Secondary | ICD-10-CM

## 2017-06-12 ENCOUNTER — Ambulatory Visit: Payer: Self-pay | Admitting: Osteopathic Medicine

## 2017-07-09 ENCOUNTER — Ambulatory Visit (INDEPENDENT_AMBULATORY_CARE_PROVIDER_SITE_OTHER): Payer: Self-pay | Admitting: Osteopathic Medicine

## 2017-07-09 VITALS — BP 143/87 | HR 79 | Wt 164.0 lb

## 2017-07-09 DIAGNOSIS — F988 Other specified behavioral and emotional disorders with onset usually occurring in childhood and adolescence: Secondary | ICD-10-CM

## 2017-07-09 MED ORDER — AMPHETAMINE-DEXTROAMPHETAMINE 30 MG PO TABS: 30.0000 mg | ORAL_TABLET | Freq: Two times a day (BID) | ORAL | 0 refills | Status: DC

## 2017-07-09 MED ORDER — AMPHETAMINE-DEXTROAMPHETAMINE 20 MG PO TABS: 20.0000 mg | ORAL_TABLET | Freq: Two times a day (BID) | ORAL | 0 refills | Status: DC

## 2017-07-09 MED ORDER — FLUCONAZOLE 150 MG PO TABS: 150.0000 mg | ORAL_TABLET | Freq: Once | ORAL | 1 refills | Status: AC

## 2017-07-09 NOTE — Progress Notes (Signed)
HPI: Sabrina Barrera is a 46 y.o. female  who [facility], 07/09/17,  for chief complaint of:  Chief Complaint  Patient presents with  . Follow-up    ADHD    Attention deficit: Has missed several appointments - same-day cancellations per patient Patient would like to try increased dose of medicine - feels it's not working as well as it used to, is overall helping her function better at work, no complaints of palpitations or weight loss. 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     Past medical, surgical, social and family history reviewed: Patient Active Problem List   Diagnosis Date Noted  . ADD (attention deficit disorder) 07/02/2016  . Anxiety 07/02/2016  . History of drug overdose 07/02/2016  . History of alcoholism (HCC) 09/28/2015   Past Surgical History:  Procedure Laterality Date  . CESAREAN SECTION     Social History   Tobacco Use  . Smoking status: Current Every Day Smoker    Packs/day: 1.00    Types: Cigarettes  . Smokeless tobacco: Never Used  Substance Use Topics  . Alcohol use: Yes    Alcohol/week: 0.5 oz    Types: 1 Standard drinks or equivalent per week    Comment: once a month   Family History  Problem Relation Age of Onset  . Cancer Mother   . Hypertension Mother   . Alcohol abuse Father      Current medication list and allergy/intolerance information reviewed:   Current Outpatient Medications on File Prior to Visit  Medication Sig Dispense Refill  . amphetamine-dextroamphetamine (ADDERALL) 20 MG tablet Take 1 tablet (20 mg total) by mouth 2 (two) times daily. 60 tablet 0   No current facility-administered medications on file prior to visit.    Allergies  Allergen Reactions  . Zithromax [Azithromycin] Other (See Comments)    Patient reports having hallucinations and being seen in the ED.  [name]. Penicillins Other (See Comments)    Patient  does not recall reactions as she has not had PCN since she was a child.      Review of Systems:  Constitutional: +recent illness w/ flu but better now  HEENT: No  headache, no vision change  Cardiac: No  chest pain, No  pressure, No palpitations  Respiratory:  No  shortness of breath.    Exam:  BP (!) 143/87   Pulse 79   Wt 164 lb (74.4 kg)   BMI 25.69 kg/m   Constitutional: VS see above. General Appearance: alert, well-developed, well-nourished, NAD  Eyes: Normal lids and conjunctive, non-icteric sclera  Ears, Nose, Mouth, Throat: MMM, Normal external inspection ears/nares/mouth/lips/gums.  Neck: No masses, trachea midline.   Respiratory: Normal respiratory effort. no wheeze, no rhonchi, no rales  Cardiovascular: S1/S2 normal, no murmur, no rub/gallop auscultated. RRR.   Musculoskeletal: Gait normal. Symmetric and independent movement of all extremities  Neurological: Normal balance/coordination. No tremor.  Skin: warm, dry, intact.   Psychiatric: Normal judgment/insight. Normal mood and affect. Oriented x3.     ASSESSMENT/PLAN: The encounter diagnosis was Attention deficit disorder (ADD) in adult.   3 mos supply Rx provided, we increased dose to 30 mg bid, if this isn't helping will likely need to switch drugs altogether since this is max dose        Visit summary with medication list and pertinent instructions was printed for patient to review. All  questions at time of visit were answered - patient instructed to contact office with any additional concerns. ER/RTC precautions were reviewed with the patient. Follow-up plan: Return in about 3 months (around 10/09/2017) for refill and recheck on ADD medications .

## 2017-07-10 MED ORDER — SODIUM CHLORIDE 0.9 % IV SOLN
INTRAVENOUS | Status: DC
Start: ? — End: 2017-07-10

## 2017-07-10 MED ORDER — GENERIC EXTERNAL MEDICATION
Status: DC
Start: ? — End: 2017-07-10

## 2017-10-03 ENCOUNTER — Ambulatory Visit (INDEPENDENT_AMBULATORY_CARE_PROVIDER_SITE_OTHER): Payer: Medicaid Other | Admitting: Osteopathic Medicine

## 2017-10-03 ENCOUNTER — Encounter: Payer: Self-pay | Admitting: Osteopathic Medicine

## 2017-10-03 VITALS — BP 137/78 | HR 85 | Temp 98.2°F | Wt 167.0 lb

## 2017-10-03 DIAGNOSIS — F988 Other specified behavioral and emotional disorders with onset usually occurring in childhood and adolescence: Secondary | ICD-10-CM

## 2017-10-03 DIAGNOSIS — F341 Dysthymic disorder: Secondary | ICD-10-CM

## 2017-10-03 MED ORDER — AMPHETAMINE-DEXTROAMPHETAMINE 30 MG PO TABS: 30.0000 mg | ORAL_TABLET | Freq: Two times a day (BID) | ORAL | 0 refills | Status: DC

## 2017-10-03 MED ORDER — BUPROPION HCL ER (XL) 150 MG PO TB24: 150.0000 mg | ORAL_TABLET | Freq: Every day | ORAL | 1 refills | Status: DC

## 2017-10-03 NOTE — Patient Instructions (Addendum)
Weight loss: important things to remember  It is hard work! You will have setbacks, but don't get discouraged. The goal is not short-term success, it is long-term health.   Looking at the numbers is important to track your progress and set goals, but how you are feeling and your overall health are the most important things! BMI and pounds and calories and miles logged aren't everything - they are tools to help us reach your goals.  You can do this!!!   Things to remember for exercise for weight loss:   Please note - I am not a certified personal trainer. I can present you with ideas and general workout goals, but an exercise program is largely up to you. Find something you can stick with, and something you enjoy!   As you progress in your exercise regimen think about gradually increasing the following, week by week:   intensity (how strenuous is your workout)  frequency (how often you are exercising)  duration (how many minutes at a time you are exercising)  Walking for 20 minutes a day is certainly better than nothing, but more strenuous exercise will develop better cardiovascular fitness.   interval training (high-intensity alternating with low-intensity, think walk/jog rather than just walk)  muscle strengthening exercises (weight lifting, calisthenics, yoga) - this also helps prevent osteoporosis!   Things to remember for diet changes for weight loss:   Please note - I am not a certified dietician. I can present you with ideas and general diet goals, but a meal plan is largely up to you. I am happy to refer you to a dietician who can give you a detailed meal plan.  Apps/logs are crucial to track how you're eating! It's not realistic to be logging everything you eat forever, but when you're starting a healthy eating lifestyle it's very helpful, and checking in with logs now and then helps you stick to your program!   Calorie restriction with the goal weight loss of no more than one  to one and a half pounds per week.   Increase lean protein such as chicken, fish, [place].   Decrease fatty foods such as dairy, butter.   Decrease sugary foods. Avoid sugary drinks such as soda or juice.  Increase fiber found in fruit and vegetables.    Medications approved for long-term use for obesity  Saxenda (Liraglutide 3 mg/day) injectable   Contrave (Bupropion and Naltrexone)  Lorcaserin (Belviq or Belviq XR)  Orlistat (Xenical, Alli over-the-counter)  Bupropion (Wellbutrin) I recommend that you research the above medications and see which one(s) your insurance may or may not cover: If you call your insurance, ask them specifically what medications are on their formulary that are approved for obesity treatment. They should be able to send you a list or tell you over the phone. Remember, medications aren't magic! You MUST be diligent about lifestyle changes as well!

## 2017-10-03 NOTE — Progress Notes (Signed)
HPI: Sabrina Barrera is a 47 y.o. female  who presents to [facility] today, 10/03/17,  for chief complaint of:  ADHD follow-up Weight gain   Medication is overall helping her function better at work, no complaints of palpitations or weight loss, in fact has gained weight over the past year. 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  3 mos supply Rx provided at last visit, we increased dose to 30 mg bid, if this wasn't helping, we discussed there would be a need to switch drugs altogether since this is max dose  She states her attention is not a particular issue for her. Weight gain of about 30 pounds over the past year is a bit distressing. She is not able to drive, she has been cooped up inside, depression means that she has been eating a bit more lately.   Past medical, surgical, social and family history reviewed: Patient Active Problem List   Diagnosis Date Noted  . ADD (attention deficit disorder) 07/02/2016  . Anxiety 07/02/2016  . History of drug overdose 07/02/2016  . History of alcoholism (HCC) 09/28/2015   Past Surgical History:  Procedure Laterality Date  . CESAREAN SECTION     Social History   Tobacco Use  . Smoking status: Current Every Day Smoker    Packs/day: 1.00    Types: Cigarettes  . Smokeless tobacco: Never Used  Substance Use Topics  . Alcohol use: Yes    Alcohol/week: 0.5 oz    Types: 1 Standard drinks or equivalent per week    Comment: once a month   Family History  Problem Relation Age of Onset  . Cancer Mother   . Hypertension Mother   . Alcohol abuse Father      Current medication list and allergy/intolerance information reviewed:   Current Outpatient Medications on File Prior to Visit  Medication Sig Dispense Refill  . amphetamine-dextroamphetamine (ADDERALL) 30 MG tablet Take 1 tablet 2 (two) times daily by mouth. 60 tablet 0   No current  facility-administered medications on file prior to visit.    Allergies  Allergen Reactions  . Zithromax [Azithromycin] Other (See Comments)    Patient reports having hallucinations and being seen in the ED.  [name] Penicillins Other (See Comments)    Patient does not recall reactions as she has not had PCN since she was a child.      Review of Systems:  Constitutional: +recent illness w/ flu but better now  HEENT: No  headache, no vision change  Cardiac: No  chest pain, No  pressure, No palpitations  Respiratory:  No  shortness of breath.    Exam:  BP 137/78   Pulse 85   Temp 98.2 F (36.8 C) (Oral)   Wt 167 lb 0.6 oz (75.8 kg)   BMI 26.16 kg/m   Constitutional: VS see above. General Appearance: alert, well-developed, well-nourished, NAD  Eyes: Normal lids and conjunctive, non-icteric sclera  Ears, Nose, Mouth, Throat: MMM, Normal external inspection ears/nares/mouth/lips/gums.  Neck: No masses, trachea midline.   Respiratory: Normal respiratory effort. no wheeze, no rhonchi, no rales  Cardiovascular: S1/S2 normal, no murmur, no rub/gallop auscultated. RRR.   Musculoskeletal: Gait normal. Symmetric and independent movement of all extremities  Neurological: Normal balance/coordination. No tremor.  Skin: warm, dry, intact.   Psychiatric: Normal judgment/insight. Normal mood and affect. Oriented x3.     ASSESSMENT/PLAN: The primary encounter diagnosis  was Attention deficit disorder (ADD) in adult. A diagnosis of Dysthymia was also pertinent to this visit.   Patient prefers printed prescriptions for ADHD medication, 3 prescriptions were given to her at this visit.   Wellbutrin would be a good option to help treat mood/depression as well as augment ADHD treatment and hopefully help as well with weight loss. She is interested in other weight loss medications, list was given to her once I think might be helpful for her and she is to call her insurance company to see which  may be covered. We had a discussion on lifestyle modifications as well. See printed patient instructions on AVS  Meds ordered this encounter  Medications  . buPROPion (WELLBUTRIN XL) 150 MG 24 hr tablet    Sig: Take 1 tablet (150 mg total) by mouth daily.    Dispense:  90 tablet    Refill:  1  . DISCONTD: amphetamine-dextroamphetamine (ADDERALL) 30 MG tablet    Sig: Take 1 tablet by mouth 2 (two) times daily.    Dispense:  60 tablet    Refill:  0  . DISCONTD: amphetamine-dextroamphetamine (ADDERALL) 30 MG tablet    Sig: Take 1 tablet by mouth 2 (two) times daily.    Dispense:  60 tablet    Refill:  0  . amphetamine-dextroamphetamine (ADDERALL) 30 MG tablet    Sig: Take 1 tablet by mouth 2 (two) times daily.    Dispense:  60 tablet    Refill:  0          Visit summary with medication list and pertinent instructions was printed for patient to review. All questions at time of visit were answered - patient instructed to contact office with any additional concerns. ER/RTC precautions were reviewed with the patient. Follow-up plan: Return in about 3 months (around 12/31/2017) for refill ADHD medications, sooner if needed .

## 2017-12-31 ENCOUNTER — Ambulatory Visit: Payer: Medicaid Other | Admitting: Osteopathic Medicine

## 2018-01-01 ENCOUNTER — Other Ambulatory Visit: Payer: Self-pay

## 2018-01-01 DIAGNOSIS — F988 Other specified behavioral and emotional disorders with onset usually occurring in childhood and adolescence: Secondary | ICD-10-CM

## 2018-01-01 NOTE — Telephone Encounter (Signed)
Sabrina Barrera called for a refill of Adderall. She was rescheduled for follow up to next week.

## 2018-01-02 MED ORDER — AMPHETAMINE-DEXTROAMPHETAMINE 30 MG PO TABS: 30.0000 mg | ORAL_TABLET | Freq: Two times a day (BID) | ORAL | 0 refills | Status: DC

## 2018-01-08 ENCOUNTER — Ambulatory Visit: Payer: Medicaid Other | Admitting: Osteopathic Medicine

## 2018-01-14 ENCOUNTER — Ambulatory Visit (INDEPENDENT_AMBULATORY_CARE_PROVIDER_SITE_OTHER): Payer: Self-pay | Admitting: Osteopathic Medicine

## 2018-01-14 ENCOUNTER — Encounter: Payer: Self-pay | Admitting: Osteopathic Medicine

## 2018-01-14 VITALS — BP 138/89 | HR 70 | Temp 98.3°F | Wt 157.4 lb

## 2018-01-14 DIAGNOSIS — X503XXA Overexertion from repetitive movements, initial encounter: Secondary | ICD-10-CM

## 2018-01-14 DIAGNOSIS — F988 Other specified behavioral and emotional disorders with onset usually occurring in childhood and adolescence: Secondary | ICD-10-CM

## 2018-01-14 DIAGNOSIS — S39012A Strain of muscle, fascia and tendon of lower back, initial encounter: Secondary | ICD-10-CM

## 2018-01-14 MED ORDER — AMPHETAMINE-DEXTROAMPHETAMINE 30 MG PO TABS: 30.0000 mg | ORAL_TABLET | Freq: Two times a day (BID) | ORAL | 0 refills | Status: DC

## 2018-01-14 NOTE — Patient Instructions (Signed)
TENS unit - look it up on [place]!  Try OTC ibuprofen or aleve as needed

## 2018-01-14 NOTE — Progress Notes (Signed)
HPI: Sabrina Barrera is a 47 y.o. female  who presents to [facility] [place] today, 01/14/18,  for chief complaint of:  ADHD follow-up Weight gain   Medication is overall helping her function better at work, no complaints of palpitations or weight loss. 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 No injury but thinks at some point she may have twisted and felt popping sensation.   3 mos supply Rx provided today, pt requests printed Rx      Past medical, surgical, social and family history reviewed: Patient Active Problem List   Diagnosis Date Noted  . ADD (attention deficit disorder) 07/02/2016  . Anxiety 07/02/2016  . History of drug overdose 07/02/2016  . History of alcoholism (HCC) 09/28/2015   Past Surgical History:  Procedure Laterality Date  . CESAREAN SECTION     Social History   Tobacco Use  . Smoking status: Current Every Day Smoker    Packs/day: 1.00    Types: Cigarettes  . Smokeless tobacco: Never Used  Substance Use Topics  . Alcohol use: Yes    Alcohol/week: 0.5 oz    Types: 1 Standard drinks or equivalent per week    Comment: once a month   Family History  Problem Relation Age of Onset  . Cancer Mother   . Hypertension Mother   . Alcohol abuse Father      Current medication list and allergy/intolerance information reviewed:   Current Outpatient Medications on File Prior to Visit  Medication Sig Dispense Refill  . amphetamine-dextroamphetamine (ADDERALL) 30 MG tablet Take 1 tablet by mouth 2 (two) times daily. 60 tablet 0  . buPROPion (WELLBUTRIN XL) 150 MG 24 hr tablet Take 1 tablet (150 mg total) by mouth daily. 90 tablet 1   No current facility-administered medications on file prior to visit.    Allergies  Allergen Reactions  . Azithromycin Other (See Comments)    Patient reports having hallucinations and  being seen in the ED.  [name] Penicillins Other (See Comments) and Rash    Patient does not recall reactions as she has not had PCN since she was a child.      Review of Systems:  Constitutional: No fever/chills   HEENT: No  headache, no vision change  Cardiac: No  chest pain, No  pressure, No palpitations  MSK: see hpi   Respiratory:  No  shortness of breath.    Exam:  BP 138/89 (BP Location: Left Arm, Patient Position: Sitting, Cuff Size: Normal)   Pulse 70   Temp 98.3 F (36.8 C) (Oral)   Wt 157 lb 6.4 oz (71.4 kg)   BMI 24.65 kg/m   Constitutional: VS see above. General Appearance: alert, well-developed, well-nourished, NAD  Eyes: Normal lids and conjunctive, non-icteric sclera  Ears, Nose, Mouth, Throat: MMM, Normal external inspection ears/nares/mouth/lips/gums.  Neck: No masses, trachea midline.   Respiratory: Normal respiratory effort. no wheeze, no rhonchi, no rales  Cardiovascular: S1/S2 normal, no murmur, no rub/gallop auscultated. RRR.   Musculoskeletal: Gait normal. Symmetric and independent movement of all extremities. (+)paraspinal tendnerness Ql/lumbar area on R, worse w/ forward flexion, no change w/ L/R rotation or SB, neg SLR  Neurological: Normal balance/coordination. No tremor.  Skin: warm, dry, intact.   Psychiatric: Normal judgment/insight. Normal mood and affect. Oriented x3.     ASSESSMENT/PLAN: The primary encounter diagnosis was Repetitive strain injury  of lower back, initial encounter. A diagnosis of Attention deficit disorder (ADD) in adult was also pertinent to this visit.   Patient prefers printed prescriptions for ADHD medication, 3 prescriptions were given to her at this visit.   Wellbutrin would be a good option to help treat mood/depression as well as augment ADHD treatment, she hasnt filled this Rx yet, encouraged to do so if desired or let me know to take it off list next visit  MSK back strain, advised strengthening exercises,  offered formal PT  Meds ordered this encounter  Medications  . DISCONTD: amphetamine-dextroamphetamine (ADDERALL) 30 MG tablet    Sig: Take 1 tablet by mouth 2 (two) times daily.    Dispense:  60 tablet    Refill:  0  . DISCONTD: amphetamine-dextroamphetamine (ADDERALL) 30 MG tablet    Sig: Take 1 tablet by mouth 2 (two) times daily.    Dispense:  60 tablet    Refill:  0  . amphetamine-dextroamphetamine (ADDERALL) 30 MG tablet    Sig: Take 1 tablet by mouth 2 (two) times daily.    Dispense:  60 tablet    Refill:  0      Total time spent 15 minutes, greater than 50% of the visit was counseling and coordinating care for diagnosis of The primary encounter diagnosis was Repetitive strain injury of lower back, initial encounter. A diagnosis of Attention deficit disorder (ADD) in adult was also pertinent to this visit. .     Visit summary with medication list and pertinent instructions was printed for patient to review. All questions at time of visit were answered - patient instructed to contact office with any additional concerns. ER/RTC precautions were reviewed with the patient.   Follow-up plan: Return for when due for med refills (early 04/2018).

## 2018-04-25 ENCOUNTER — Ambulatory Visit: Payer: Medicaid Other | Admitting: Osteopathic Medicine

## 2018-05-12 ENCOUNTER — Ambulatory Visit (INDEPENDENT_AMBULATORY_CARE_PROVIDER_SITE_OTHER): Payer: Medicaid Other | Admitting: Osteopathic Medicine

## 2018-05-12 ENCOUNTER — Encounter: Payer: Self-pay | Admitting: Osteopathic Medicine

## 2018-05-12 VITALS — BP 130/82 | HR 107 | Temp 98.6°F | Wt 157.3 lb

## 2018-05-12 DIAGNOSIS — F988 Other specified behavioral and emotional disorders with onset usually occurring in childhood and adolescence: Secondary | ICD-10-CM

## 2018-05-12 DIAGNOSIS — F341 Dysthymic disorder: Secondary | ICD-10-CM

## 2018-05-12 DIAGNOSIS — M7702 Medial epicondylitis, left elbow: Secondary | ICD-10-CM

## 2018-05-12 MED ORDER — AMPHETAMINE-DEXTROAMPHETAMINE 30 MG PO TABS: 30.0000 mg | ORAL_TABLET | Freq: Two times a day (BID) | ORAL | 0 refills | Status: DC

## 2018-05-12 NOTE — Progress Notes (Signed)
HPI: Sabrina Barrera is a 47 y.o. female  who presents to [facility] today, 05/12/18,  for chief complaint of:  ADHD follow-up Arm numbness/weakness   Medication is helping her function better at work, no complaints of palpitations or weight change. She has a job now working as a Production assistant, radio and has increased her exercise. We discussed augmenting w/ Wellbutrin but she hasn't started this.   Reports medial elbow pain and intermittent hand numbness worse on L for few months. Notes numbness worse in the monring, like she's fallen asleep on her arms. Occasional decreased grip strength. Last episode was a few weeks ago.      Past medical, surgical, social and family history reviewed: Patient Active Problem List   Diagnosis Date Noted  . ADD (attention deficit disorder) 07/02/2016  . Anxiety 07/02/2016  . History of drug overdose 07/02/2016  . History of alcoholism (HCC) 09/28/2015   Past Surgical History:  Procedure Laterality Date  . CESAREAN SECTION     Social History   Tobacco Use  . Smoking status: Current Every Day Smoker    Packs/day: 1.00    Types: Cigarettes  . Smokeless tobacco: Never Used  Substance Use Topics  . Alcohol use: Yes    Alcohol/week: 1.0 standard drinks    Types: 1 Standard drinks or equivalent per week    Comment: once a month   Family History  Problem Relation Age of Onset  . Cancer Mother   . Hypertension Mother   . Alcohol abuse Father      Current medication list and allergy/intolerance information reviewed:   Current Outpatient Medications on File Prior to Visit  Medication Sig Dispense Refill  . amphetamine-dextroamphetamine (ADDERALL) 30 MG tablet Take 1 tablet by mouth 2 (two) times daily. 60 tablet 0  . buPROPion (WELLBUTRIN XL) 150 MG 24 hr tablet Take 1 tablet (150 mg total) by mouth daily. 90 tablet 1   No current facility-administered medications on file prior to visit.    Allergies  Allergen  Reactions  . Azithromycin Other (See Comments)    Patient reports having hallucinations and being seen in the ED.  [name] Penicillins Other (See Comments) and Rash    Patient does not recall reactions as she has not had PCN since she was a child.      Review of Systems:  Constitutional: No fever/chills   HEENT: No  headache, no vision change  Cardiac: No  chest pain, No  pressure, No palpitations  MSK: see HPI  Respiratory:  No  shortness of breath.    Exam:  BP 130/82 (BP Location: Left Arm, Patient Position: Sitting, Cuff Size: Normal)   Pulse (!) 107   Temp 98.6 F (37 C) (Oral)   Wt 157 lb 4.8 oz (71.4 kg)   BMI 24.64 kg/m   Constitutional: VS see above. General Appearance: alert, well-developed, well-nourished, NAD  Eyes: Normal lids and conjunctive, non-icteric sclera  Ears, Nose, Mouth, Throat: MMM, Normal external inspection ears/nares/mouth/lips/gums.  Neck: No masses, trachea midline.   Respiratory: Normal respiratory effort. no wheeze, no rhonchi, no rales  Cardiovascular: S1/S2 normal, no murmur, no rub/gallop auscultated. RRR.   Musculoskeletal: Tenderness at medial epicondyle left arm worse with flexion of wrist, Tinel's/Phalen's negative left  Neurological: Normal balance/coordination. No tremor.  Skin: warm, dry, intact.   Psychiatric: Normal judgment/insight. Normal mood and affect. Oriented x3.     ASSESSMENT/PLAN: The primary encounter diagnosis was Attention deficit disorder (ADD) in adult. Diagnoses  of Medial epicondylitis of left elbow and Dysthymia were also pertinent to this visit.   Patient prefers printed prescriptions for ADHD medication, 3 prescriptions were given to her at this visit.   She states she is going to pick up the Wellbutrin today and start this, she has been feeling a bit down lately due to some conflicts with friends, her daughter recently got married and moved away too.  Is looking for a therapist/counselor.  Instructions  provided for home exercises for medial epicondylitis and possible carpal tunnel, patient encouraged to purchase a splint at the pharmacy and sleep in this and see if it makes a difference, despite negative exam today.  Consider neurology referral     Meds ordered this encounter  Medications  . DISCONTD: amphetamine-dextroamphetamine (ADDERALL) 30 MG tablet    Sig: Take 1 tablet by mouth 2 (two) times daily.    Dispense:  60 tablet    Refill:  0  . DISCONTD: amphetamine-dextroamphetamine (ADDERALL) 30 MG tablet    Sig: Take 1 tablet by mouth 2 (two) times daily.    Dispense:  60 tablet    Refill:  0  . amphetamine-dextroamphetamine (ADDERALL) 30 MG tablet    Sig: Take 1 tablet by mouth 2 (two) times daily.    Dispense:  60 tablet    Refill:  0      Total time spent 25 minutes, greater than 50% of the visit was counseling and coordinating care for diagnosis of The primary encounter diagnosis was Attention deficit disorder (ADD) in adult. A diagnosis of Medial epicondylitis of left elbow was also pertinent to this visit. .     Visit summary with medication list and pertinent instructions was printed for patient to review. All questions at time of visit were answered - patient instructed to contact office with any additional concerns. ER/RTC precautions were reviewed with the patient.   Follow-up plan: Return in about 3 months (around 08/11/2018) for refill ADHD medication - sooner if needed.

## 2018-07-09 ENCOUNTER — Telehealth: Payer: Self-pay

## 2018-07-09 NOTE — Telephone Encounter (Signed)
Pt stopped by requesting an early refill for adderall med. Pt will be leaving out of town this afternoon. Rx is due for refill on 07/11/18. Pls advise, thanks.

## 2018-07-10 NOTE — Telephone Encounter (Signed)
We need more than a few hours notice for refill requests. Will check PMP and review appropriateness of early refill

## 2018-07-14 NOTE — Telephone Encounter (Signed)
PMP reviewed today 07/14/18  Filled Adderall 07/11/18

## 2018-07-20 MED ORDER — SODIUM CHLORIDE 0.9 % IV SOLN
1000.00 | INTRAVENOUS | Status: DC
Start: ? — End: 2018-07-20

## 2018-08-11 ENCOUNTER — Ambulatory Visit (INDEPENDENT_AMBULATORY_CARE_PROVIDER_SITE_OTHER): Payer: Medicaid Other | Admitting: Osteopathic Medicine

## 2018-08-11 ENCOUNTER — Encounter: Payer: Self-pay | Admitting: Osteopathic Medicine

## 2018-08-11 VITALS — BP 129/84 | HR 81 | Temp 98.2°F | Wt 167.5 lb

## 2018-08-11 DIAGNOSIS — F988 Other specified behavioral and emotional disorders with onset usually occurring in childhood and adolescence: Secondary | ICD-10-CM

## 2018-08-11 DIAGNOSIS — F1021 Alcohol dependence, in remission: Secondary | ICD-10-CM

## 2018-08-11 MED ORDER — AMPHETAMINE-DEXTROAMPHETAMINE 30 MG PO TABS: 30.0000 mg | ORAL_TABLET | Freq: Two times a day (BID) | ORAL | 0 refills | Status: DC

## 2018-08-11 MED ORDER — BUPROPION HCL ER (XL) 150 MG PO TB24: 150.0000 mg | ORAL_TABLET | Freq: Every day | ORAL | 1 refills | Status: DC

## 2018-08-12 ENCOUNTER — Encounter: Payer: Self-pay | Admitting: Osteopathic Medicine

## 2018-08-12 NOTE — Progress Notes (Signed)
HPI: Sabrina Barrera is a 47 y.o. female who  has a past medical history of ADD (attention deficit disorder), Bacterial vaginosis, and Panic attack.  she presents to [facility] today, 08/12/18,  for chief complaint of:  ADD follow-up   Here today for refill on ADD medications.  We also touched on recent ER visit for alcohol intoxication, UDS done at that time did not show positive for prescribed Adderall.  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  She would like to try to get another job, she notes that she has a tendency to drink more than she should when she feels that she is self-medicating for depression.   At today's visit... Past medical history, surgical history, and family history reviewed and updated as needed.  Current medication list and allergy/intolerance information reviewed and updated as needed. (See remainder of HPI, ROS, Phys Exam below)          ASSESSMENT/PLAN: The primary encounter diagnosis was Attention deficit disorder, unspecified hyperactivity presence. Diagnoses of History of alcoholism (HCC) and Attention deficit disorder (ADD) in adult were also pertinent to this visit.    Meds ordered this encounter  Medications  . buPROPion (WELLBUTRIN XL) 150 MG 24 hr tablet    Sig: Take 1 tablet (150 mg total) by mouth daily.    Dispense:  90 tablet    Refill:  1  . amphetamine-dextroamphetamine (ADDERALL) 30 MG tablet    Sig: Take 1 tablet by mouth 2 (two) times daily.    Dispense:  60 tablet    Refill:  0   Will follow-up in 4 weeks, would strongly consider repeat UDS at that time, I am sympathetic to the fact that patient is self-pay, we discussed the dangers of  self-medicating, importance of coming to talk to me or at least calling the office if feeling she is struggling with depression or other mood issues.  Follow-up plan: Return in about 4 weeks (around 09/08/2018) for rechekc moods and refill or adjust medications - see me sooner if needed .                             ############################################ ############################################ ############################################ ############################################    Current Meds  Medication Sig  . buPROPion (WELLBUTRIN XL) 150 MG 24 hr tablet Take 1 tablet (150 mg total) by mouth daily.  . [DISCONTINUED] buPROPion (WELLBUTRIN XL) 150 MG 24 hr tablet Take 1 tablet (150 mg total) by mouth daily.    Allergies  Allergen Reactions  . Azithromycin Other (See Comments)    Patient reports having hallucinations and being seen in the ED.  [name]. Penicillins Other (See Comments) and Rash    Patient does not recall reactions as she has not had PCN since she was a child.       Review of Systems:  Constitutional: No recent illness except as per HPI   HEENT: No  headache, no vision change  Cardiac: No  chest pain, No  pressure, No palpitations  Respiratory:  No  shortness of breath. No  Cough  Gastrointestinal: No  abdominal pain, no change on bowel habits  Neurologic: No  weakness, No  Dizziness  Psychiatric: +concerns with depression, +concerns with anxiety  Exam:  BP 129/84 (BP Location: Left Arm, Patient Position: Sitting, Cuff Size: Normal)   Pulse 81   Temp 98.2 F (36.8 C) (Oral)   Wt 167 lb 8 oz (76 kg)   BMI 26.23 kg/m   Constitutional: VS see above. General Appearance: alert, well-developed, well-nourished, NAD  Eyes: Normal lids and conjunctive, non-icteric sclera  Ears, Nose, Mouth, Throat: MMM, Normal external inspection ears/nares/mouth/lips/gums.  Neck: No masses, trachea midline.   Respiratory: Normal  respiratory effort. no wheeze, no rhonchi, no rales  Cardiovascular: S1/S2 normal, no murmur, no rub/gallop auscultated. RRR.   Musculoskeletal: Gait normal. Symmetric and independent movement of all extremities  Neurological: Normal balance/coordination. No tremor.  Skin: warm, dry, intact.   Psychiatric: Normal judgment/insight. Normal mood and affect. Oriented x3.       Visit summary with medication list and pertinent instructions was printed for patient to review, patient was advised to alert [place] if any updates are needed. All questions at time of visit were answered - patient instructed to contact office with any additional concerns. ER/RTC precautions were reviewed with the patient and understanding verbalized.   Note: Total time spent 25 minutes, greater than 50% of the visit was spent face-to-face counseling and coordinating care for the following: The primary encounter diagnosis was Attention deficit disorder, unspecified hyperactivity presence. Diagnoses of History of alcoholism (HCC) and Attention deficit disorder (ADD) in adult were also pertinent to this visit.[name]  Please note: voice recognition software was used to produce this document, and typos may escape review. Please contact Dr. Lyn Hollingshead for any needed clarifications.    Follow up plan: Return in about 4 weeks (around 09/08/2018) for rechekc moods and refill or adjust medications - see me sooner if needed .

## 2018-09-08 ENCOUNTER — Ambulatory Visit: Payer: Medicaid Other | Admitting: Osteopathic Medicine

## 2018-09-15 ENCOUNTER — Ambulatory Visit (INDEPENDENT_AMBULATORY_CARE_PROVIDER_SITE_OTHER): Payer: Medicaid Other | Admitting: Osteopathic Medicine

## 2018-09-15 ENCOUNTER — Encounter: Payer: Self-pay | Admitting: Osteopathic Medicine

## 2018-09-15 VITALS — BP 116/74 | HR 89 | Temp 98.4°F | Wt 162.4 lb

## 2018-09-15 DIAGNOSIS — F341 Dysthymic disorder: Secondary | ICD-10-CM

## 2018-09-15 DIAGNOSIS — F988 Other specified behavioral and emotional disorders with onset usually occurring in childhood and adolescence: Secondary | ICD-10-CM

## 2018-09-15 MED ORDER — AMPHETAMINE-DEXTROAMPHETAMINE 30 MG PO TABS: 30.0000 mg | ORAL_TABLET | Freq: Two times a day (BID) | ORAL | 0 refills | Status: DC

## 2018-09-15 MED ORDER — FLUCONAZOLE 150 MG PO TABS: 150.0000 mg | ORAL_TABLET | Freq: Once | ORAL | 1 refills | Status: DC

## 2018-09-15 NOTE — Progress Notes (Signed)
HPI: Sabrina Barrera is a 48 y.o. female who  has a past medical history of ADD (attention deficit disorder), Bacterial vaginosis, and Panic attack.  she presents to [facility] today, 09/15/18,  for chief complaint of:  ADD follow-up   Last visit 08/11/18: refill on ADD medications.  We also touched on recent ER visit for alcohol intoxication, UDS done at that time did not show positive for prescribed Adderall.  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  She would like to try to get another job, she notes that she has a tendency to drink more than she should when she feels that she is self-medicating for depression.    TODAY: improvement in mood! Doing well on medicines. See PHQ/GAD below.   Depression screen [facility] 2/9 09/15/2018 08/11/2018 10/03/2017  Decreased Interest 0 2 0  Down, Depressed, Hopeless 0 2 0  PHQ - 2 Score 0 4 0  Altered sleeping 0 2 0  Tired, decreased energy 0 2 0  Change in appetite 0 2 0  Feeling bad or failure about yourself  0 2 0  Trouble concentrating 0 0 0  Moving slowly or fidgety/restless 0 0 0  Suicidal thoughts 0 0 0  PHQ-9 Score 0 12 0  Difficult doing work/chores - Somewhat difficult -   GAD 7 : Generalized Anxiety Score 09/15/2018 08/11/2018 10/03/2017  Nervous, Anxious, on Edge 1 1 0  Control/stop worrying 1 1 0  Worry too much - different things 1 1 0  Trouble relaxing 0 1 0  Restless 0 0 0  Easily annoyed or irritable 0 1 0  Afraid - awful might happen 0 0 0  Total GAD 7 Score 3 5 0  Anxiety Difficulty Somewhat difficult Somewhat difficult -       At today's visit... Past medical history, surgical history, and family history reviewed and updated as needed.   Current medication list and allergy/intolerance information reviewed and updated as needed. (See remainder of HPI, ROS, Phys Exam below)          ASSESSMENT/PLAN: The primary encounter diagnosis was Attention deficit disorder, unspecified hyperactivity presence. Diagnoses of Dysthymia and Attention deficit disorder (ADD) in adult were also pertinent to this visit.  3 printed Rx provided to patient at end of visit   Meds ordered this encounter  Medications  . DISCONTD: amphetamine-dextroamphetamine (ADDERALL) 30 MG tablet    Sig: Take 1 tablet by mouth 2 (two) times daily for 30 days.    Dispense:  60 tablet    Refill:  0  . DISCONTD: amphetamine-dextroamphetamine (ADDERALL) 30 MG tablet    Sig: Take 1 tablet by mouth 2 (two) times daily for 30 days.    Dispense:  60 tablet    Refill:  0  . amphetamine-dextroamphetamine (ADDERALL) 30 MG tablet    Sig: Take 1 tablet by mouth 2 (two) times daily for 30 days.    Dispense:  60 tablet    Refill:  0  . fluconazole (DIFLUCAN) 150 MG tablet    Sig: Take 1 tablet (150 mg total) by mouth once for 1 dose. Repeat dose 72 hours if yeast infection persists    Dispense:  2 tablet  Refill:  1     Follow-up plan: Return in about 3 months (around 12/15/2018) for ADD FOLLOW-UP, additional visit for procedure - lip lesion removal if needed (OV40 end of day) .                             ############################################ ############################################ ############################################ ############################################    Current Meds  Medication Sig  . buPROPion (WELLBUTRIN XL) 150 MG 24 hr tablet Take 1 tablet (150 mg total) by mouth daily.    Allergies  Allergen Reactions  . Azithromycin Other (See Comments)    Patient reports having hallucinations and being seen in the ED.  [name] Penicillins Other (See Comments) and Rash    Patient does not recall reactions  as she has not had PCN since she was a child.       Review of Systems:  Constitutional: No recent illness except as per HPI   HEENT: No  headache, no vision change. +Lip bump she'd like removed  Cardiac: No  chest pain, No  pressure, No palpitations  Respiratory:  No  shortness of breath. No  Cough  Neurologic: No  weakness, No  Dizziness  Psychiatric: No concerns with depression,No concerns with anxiety  Exam:  BP 116/74 (BP Location: Left Arm, Patient Position: Sitting, Cuff Size: Normal)   Pulse 89   Temp 98.4 F (36.9 C) (Oral)   Wt 162 lb 6.4 oz (73.7 kg)   BMI 25.44 kg/m   Constitutional: VS see above. General Appearance: alert, well-developed, well-nourished, NAD  Eyes: Normal lids and conjunctive, non-icteric sclera  Ears, Nose, Mouth, Throat: MMM, Normal external inspection ears/nares/mouth/lips/gums. L lower lateral lip, fibrous pink polyp-like lesion inner mucosa  Neck: No masses, trachea midline.   Respiratory: Normal respiratory effort. no wheeze, no rhonchi, no rales  Cardiovascular: S1/S2 normal, no murmur, no rub/gallop auscultated. RRR.   Musculoskeletal: Gait normal. Symmetric and independent movement of all extremities  Neurological: Normal balance/coordination. No tremor.  Skin: warm, dry, intact.   Psychiatric: Normal judgment/insight. Normal mood and affect. Oriented x3.       Visit summary with medication list and pertinent instructions was printed for patient to review, patient was advised to alert [place] if any updates are needed. All questions at time of visit were answered - patient instructed to contact office with any additional concerns. ER/RTC precautions were reviewed with the patient and understanding verbalized.      Please note: voice recognition software was used to produce this document, and typos may escape review. Please contact Dr. Lyn Hollingshead for any needed clarifications.    Follow up plan: Return in about 3 months  (around 12/15/2018) for ADD FOLLOW-UP, additional visit for procedure - lip lesion removal if needed (OV40 end of day) .

## 2018-10-06 ENCOUNTER — Ambulatory Visit: Payer: Medicaid Other | Admitting: Osteopathic Medicine

## 2018-10-06 ENCOUNTER — Telehealth: Payer: Self-pay | Admitting: Osteopathic Medicine

## 2018-10-06 NOTE — Telephone Encounter (Signed)
Patient called in to cancel appointment for today. States she was not feeling well and did not want to come in. Will call back to reschedule. No further questions at this time.

## 2018-10-07 ENCOUNTER — Ambulatory Visit: Payer: Medicaid Other | Admitting: Osteopathic Medicine

## 2018-12-11 ENCOUNTER — Ambulatory Visit: Payer: Medicaid Other | Admitting: Osteopathic Medicine

## 2018-12-11 ENCOUNTER — Ambulatory Visit (INDEPENDENT_AMBULATORY_CARE_PROVIDER_SITE_OTHER): Payer: Medicaid Other | Admitting: Osteopathic Medicine

## 2018-12-11 ENCOUNTER — Other Ambulatory Visit: Payer: Self-pay

## 2018-12-11 ENCOUNTER — Encounter: Payer: Self-pay | Admitting: Osteopathic Medicine

## 2018-12-11 VITALS — BP 125/70 | Temp 98.1°F | Wt 157.6 lb

## 2018-12-11 DIAGNOSIS — F341 Dysthymic disorder: Secondary | ICD-10-CM

## 2018-12-11 DIAGNOSIS — F988 Other specified behavioral and emotional disorders with onset usually occurring in childhood and adolescence: Secondary | ICD-10-CM

## 2018-12-11 DIAGNOSIS — F419 Anxiety disorder, unspecified: Secondary | ICD-10-CM

## 2018-12-11 MED ORDER — ESCITALOPRAM OXALATE 10 MG PO TABS: 10.0000 mg | ORAL_TABLET | Freq: Every day | ORAL | 2 refills | Status: DC

## 2018-12-11 MED ORDER — AMPHETAMINE-DEXTROAMPHETAMINE 30 MG PO TABS: 30.0000 mg | ORAL_TABLET | Freq: Two times a day (BID) | ORAL | 0 refills | Status: DC

## 2018-12-11 NOTE — Progress Notes (Signed)
Virtual Visit  via  Phone Note  I connected with      Sabrina Barrera on 12/11/18 at 1:30 by a telemedicine application and verified that I am speaking with the correct person using two identifiers.   I discussed the limitations of evaluation and management by telemedicine and the availability of in person appointments. The patient expressed understanding and agreed to proceed.  History of Present Illness: Sabrina Barrera is a 48 y.o. female who would like to discuss medication refills - ADD, Anxiety   Stopped taking the Wellbutrin d/t concern for headaches  Restarted it and then headaches recurred  Would like to talk about alternatives for mood.  Daughter is on Lexapro and doing well       Observations/Objective: BP 125/70   Temp 98.1 F (36.7 C) (Oral)   Wt 157 lb 9.6 oz (71.5 kg)   BMI 24.68 kg/m  BP Readings from Last 3 Encounters:  12/11/18 125/70  09/15/18 116/74  08/11/18 129/84   Exam: Normal Speech.    Lab and Radiology Results No results found for this or any previous visit (from the past 72 hour(s)). No results found.     Assessment and Plan: 48 y.o. female with The primary encounter diagnosis was Attention deficit disorder (ADD) in adult. Diagnoses of Dysthymia and Anxiety were also pertinent to this visit.  Will trial Lexapro Refilled Adderall  PDMP reviewed during this encounter. No orders of the defined types were placed in this encounter.  Meds ordered this encounter  Medications  . escitalopram (LEXAPRO) 10 MG tablet    Sig: Take 1 tablet (10 mg total) by mouth at bedtime. (can start at 0.5 tab po daily for 3-7 days then continue at 1 tab po daily)    Dispense:  90 tablet    Refill:  2  . amphetamine-dextroamphetamine (ADDERALL) 30 MG tablet    Sig: Take 1 tablet by mouth 2 (two) times daily for 30 days.    Dispense:  60 tablet    Refill:  0    Follow Up Instructions: Return in about 4 weeks (around 01/08/2019) for virtual visit for  check on Lexapro, sooner if needed. .    I discussed the assessment and treatment plan with the patient. The patient was provided an opportunity to ask questions and all were answered. The patient agreed with the plan and demonstrated an understanding of the instructions.   The patient was advised to call back or seek an in-person evaluation if the symptoms worsen or if the condition fails to improve as anticipated.  I provided 21 minutes of non-face-to-face time during this encounter.                      Historical information moved to improve visibility of documentation.  Past Medical History:  Diagnosis Date  . ADD (attention deficit disorder)   . Bacterial vaginosis   . Panic attack    Past Surgical History:  Procedure Laterality Date  . CESAREAN SECTION     Social History   Tobacco Use  . Smoking status: Current Every Day Smoker    Packs/day: 1.00    Types: Cigarettes  . Smokeless tobacco: Never Used  Substance Use Topics  . Alcohol use: Yes    Alcohol/week: 1.0 standard drinks    Types: 1 Standard drinks or equivalent per week    Comment: once a month   family history includes Alcohol abuse in her father; Cancer in  her mother; Hypertension in her mother.  Medications: Current Outpatient Medications  Medication Sig Dispense Refill  . amphetamine-dextroamphetamine (ADDERALL) 30 MG tablet Take 1 tablet by mouth 2 (two) times daily for 30 days. 60 tablet 0  . escitalopram (LEXAPRO) 10 MG tablet Take 1 tablet (10 mg total) by mouth at bedtime. (can start at 0.5 tab po daily for 3-7 days then continue at 1 tab po daily) 90 tablet 2   No current facility-administered medications for this visit.    Allergies  Allergen Reactions  . Azithromycin Other (See Comments)    Patient reports having hallucinations and being seen in the ED.  [name]. Penicillins Other (See Comments) and Rash    Patient does not recall reactions as she has not had PCN since she was a  child.  . Wellbutrin [Bupropion] Other (See Comments)    headache    PDMP reviewed during this encounter. No orders of the defined types were placed in this encounter.  Meds ordered this encounter  Medications  . escitalopram (LEXAPRO) 10 MG tablet    Sig: Take 1 tablet (10 mg total) by mouth at bedtime. (can start at 0.5 tab po daily for 3-7 days then continue at 1 tab po daily)    Dispense:  90 tablet    Refill:  2  . amphetamine-dextroamphetamine (ADDERALL) 30 MG tablet    Sig: Take 1 tablet by mouth 2 (two) times daily for 30 days.    Dispense:  60 tablet    Refill:  0

## 2018-12-12 ENCOUNTER — Telehealth: Payer: Self-pay | Admitting: Osteopathic Medicine

## 2018-12-12 NOTE — Telephone Encounter (Signed)
lvm for pt to schedule virtual visit for check on Lexapro.  Thanks.

## 2018-12-15 ENCOUNTER — Ambulatory Visit: Payer: Medicaid Other | Admitting: Osteopathic Medicine

## 2019-01-13 ENCOUNTER — Other Ambulatory Visit: Payer: Self-pay

## 2019-01-13 DIAGNOSIS — F988 Other specified behavioral and emotional disorders with onset usually occurring in childhood and adolescence: Secondary | ICD-10-CM

## 2019-01-13 NOTE — Telephone Encounter (Signed)
Daurice called for a refill on Adderall.

## 2019-01-14 MED ORDER — AMPHETAMINE-DEXTROAMPHETAMINE 30 MG PO TABS: 30.0000 mg | ORAL_TABLET | Freq: Two times a day (BID) | ORAL | 0 refills | Status: DC

## 2019-02-16 ENCOUNTER — Other Ambulatory Visit: Payer: Self-pay

## 2019-02-16 DIAGNOSIS — F988 Other specified behavioral and emotional disorders with onset usually occurring in childhood and adolescence: Secondary | ICD-10-CM

## 2019-02-16 MED ORDER — AMPHETAMINE-DEXTROAMPHETAMINE 30 MG PO TABS: 30.0000 mg | ORAL_TABLET | Freq: Two times a day (BID) | ORAL | 0 refills | Status: DC

## 2019-02-24 ENCOUNTER — Ambulatory Visit: Payer: Medicaid Other | Admitting: Osteopathic Medicine

## 2019-04-03 ENCOUNTER — Ambulatory Visit (INDEPENDENT_AMBULATORY_CARE_PROVIDER_SITE_OTHER): Payer: Self-pay | Admitting: Osteopathic Medicine

## 2019-04-03 ENCOUNTER — Encounter: Payer: Self-pay | Admitting: Osteopathic Medicine

## 2019-04-03 VITALS — Temp 97.4°F | Wt 166.4 lb

## 2019-04-03 DIAGNOSIS — F988 Other specified behavioral and emotional disorders with onset usually occurring in childhood and adolescence: Secondary | ICD-10-CM

## 2019-04-03 DIAGNOSIS — F419 Anxiety disorder, unspecified: Secondary | ICD-10-CM

## 2019-04-03 MED ORDER — AMPHETAMINE-DEXTROAMPHETAMINE 30 MG PO TABS: 30.0000 mg | ORAL_TABLET | Freq: Two times a day (BID) | ORAL | 0 refills | Status: DC

## 2019-04-03 MED ORDER — ESCITALOPRAM OXALATE 20 MG PO TABS: 10.0000 mg | ORAL_TABLET | Freq: Every day | ORAL | 1 refills | Status: DC

## 2019-04-03 NOTE — Progress Notes (Signed)
Virtual Visit via Video (App used: Doximity) Note  I connected with      Sabrina Barrera on 04/03/19 at 11:05 AM by a telemedicine application and verified that I am speaking with the correct person using two identifiers.  Patient is at home I am in office    I discussed the limitations of evaluation and management by telemedicine and the availability of in person appointments. The patient expressed understanding and agreed to proceed.  History of Present Illness: Sabrina Barrera is a 48 y.o. female who would like to discuss follow-up medications  Lexapro restarted 11/2018, doing well on this medicine. Has bene out for a few days, was overdue for follow-up and meds were really expensive for her.    ADHD: last fill Adderall 02/16/2019, doing well on this.   Depression screen [facility] 2/9 04/03/2019 12/11/2018 09/15/2018  Decreased Interest 1 0 0  Down, Depressed, Hopeless 0 0 0  PHQ - 2 Score 1 0 0  Altered sleeping - - 0  Tired, decreased energy - - 0  Change in appetite - - 0  Feeling bad or failure about yourself  - - 0  Trouble concentrating - - 0  Moving slowly or fidgety/restless - - 0  Suicidal thoughts - - 0  PHQ-9 Score - - 0  Difficult doing work/chores - - -    GAD 7 : Generalized Anxiety Score 04/03/2019 12/11/2018 09/15/2018 08/11/2018  Nervous, Anxious, on Edge 1 0 1 1  Control/stop worrying 1 1 1 1   Worry too much - different things 1 1 1 1   Trouble relaxing 0 0 0 1  Restless 0 0 0 0  Easily annoyed or irritable 1 1 0 1  Afraid - awful might happen 0 0 0 0  Total GAD 7 Score 4 3 3 5   Anxiety Difficulty Not difficult at all Not difficult at all Somewhat difficult Somewhat difficult    .     Observations/Objective: There were no vitals taken for this visit. BP Readings from Last 3 Encounters:  12/11/18 125/70  09/15/18 116/74  08/11/18 129/84   Exam: Normal Speech.  NAD  Lab and Radiology Results No results found for this or any previous visit (from the  past 72 hour(s)). No results found.     Assessment and Plan: 48 y.o. female with The primary encounter diagnosis was Anxiety. A diagnosis of Attention deficit disorder (ADD) in adult was also pertinent to this visit.   PDMP reviewed during this encounter. No orders of the defined types were placed in this encounter.  Meds ordered this encounter  Medications  . escitalopram (LEXAPRO) 20 MG tablet    Sig: Take 0.5 tablets (10 mg total) by mouth at bedtime. Please run with goodrx coupon thanks!    Dispense:  90 tablet    Refill:  1  . DISCONTD: amphetamine-dextroamphetamine (ADDERALL) 30 MG tablet    Sig: Take 1 tablet by mouth 2 (two) times daily.    Dispense:  60 tablet    Refill:  0    Please run w/ goodrx coupon, thanks!  [name] DISCONTD: amphetamine-dextroamphetamine (ADDERALL) 30 MG tablet    Sig: Take 1 tablet by mouth 2 (two) times daily.    Dispense:  60 tablet    Refill:  0    Please run w/ goodrx coupon, thanks!  [name] DISCONTD: amphetamine-dextroamphetamine (ADDERALL) 30 MG tablet    Sig: Take 1 tablet by mouth 2 (two) times daily.    Dispense:  60 tablet    Refill:  0    Please run w/ goodrx coupon, thanks!  [name]. DISCONTD: amphetamine-dextroamphetamine (ADDERALL) 30 MG tablet    Sig: Take 1 tablet by mouth 2 (two) times daily.    Dispense:  60 tablet    Refill:  0    Please run w/ goodrx coupon, thanks!  [name]. DISCONTD: amphetamine-dextroamphetamine (ADDERALL) 30 MG tablet    Sig: Take 1 tablet by mouth 2 (two) times daily.    Dispense:  60 tablet    Refill:  0    Please run w/ goodrx coupon, thanks!  [name]. DISCONTD: amphetamine-dextroamphetamine (ADDERALL) 30 MG tablet    Sig: Take 1 tablet by mouth 2 (two) times daily.    Dispense:  60 tablet    Refill:  0    Please run w/ goodrx coupon, thanks!  [name]. amphetamine-dextroamphetamine (ADDERALL) 30 MG tablet    Sig: Take 1 tablet by mouth 2 (two) times daily.    Dispense:  60 tablet    Refill:  0    Please run w/ goodrx coupon,  thanks!   There are no Patient Instructions on file for this visit.  Instructions sent via MyChart. If MyChart not available, pt was given option for info via personal e-mail w/ no guarantee of protected health info over unsecured e-mail communication, and MyChart sign-up instructions were included.   Follow Up Instructions: Return for LAB VISIT NEXT WEEK FASTING, OTHERWISE 6 MOS FOR ANNUAL / MAINTAIN MEDICATIONS .    I discussed the assessment and treatment plan with the patient. The patient was provided an opportunity to ask questions and all were answered. The patient agreed with the plan and demonstrated an understanding of the instructions.   The patient was advised to call back or seek an in-person evaluation if any new concerns, if symptoms worsen or if the condition fails to improve as anticipated.  15 minutes of non-face-to-face time was provided during this encounter.                      Historical information moved to improve visibility of documentation.  Past Medical History:  Diagnosis Date  . ADD (attention deficit disorder)   . Bacterial vaginosis   . Panic attack    Past Surgical History:  Procedure Laterality Date  . CESAREAN SECTION     Social History   Tobacco Use  . Smoking status: Current Every Day Smoker    Packs/day: 1.00    Types: Cigarettes  . Smokeless tobacco: Never Used  Substance Use Topics  . Alcohol use: Yes    Alcohol/week: 1.0 standard drinks    Types: 1 Standard drinks or equivalent per week    Comment: once a month   family history includes Alcohol abuse in her father; Cancer in her mother; Hypertension in her mother.  Medications: Current Outpatient Medications  Medication Sig Dispense Refill  . amphetamine-dextroamphetamine (ADDERALL) 30 MG tablet Take 1 tablet by mouth 2 (two) times daily for 30 days. 60 tablet 0  . escitalopram (LEXAPRO) 10 MG tablet Take 1 tablet (10 mg total) by mouth at bedtime. (can start at  0.5 tab po daily for 3-7 days then continue at 1 tab po daily) 90 tablet 2   No current facility-administered medications for this visit.    Allergies  Allergen Reactions  . Azithromycin Other (See Comments)    Patient reports having hallucinations and being seen in the ED.  [name]. Penicillins Other (See Comments)  and Rash    Patient does not recall reactions as she has not had PCN since she was a child.  . Wellbutrin [Bupropion] Other (See Comments)    headache    PDMP not reviewed this encounter. No orders of the defined types were placed in this encounter.  No orders of the defined types were placed in this encounter.

## 2019-04-30 ENCOUNTER — Telehealth: Payer: Self-pay

## 2019-04-30 DIAGNOSIS — F988 Other specified behavioral and emotional disorders with onset usually occurring in childhood and adolescence: Secondary | ICD-10-CM

## 2019-04-30 NOTE — Telephone Encounter (Signed)
Sabrina Barrera left a message stating she needs the prescription of Adderall sent to [facility] instead of [facility].

## 2019-05-01 MED ORDER — AMPHETAMINE-DEXTROAMPHETAMINE 30 MG PO TABS: 30.0000 mg | ORAL_TABLET | Freq: Two times a day (BID) | ORAL | 0 refills | Status: DC

## 2019-05-01 NOTE — Telephone Encounter (Signed)
Sent!

## 2019-05-01 NOTE — Telephone Encounter (Signed)
We had discussed it was a lot cheaper at [facility] than at [facility] w/ the GoodRx coupon. I can still change it if she wants, but let's  just remind her of our discussion?

## 2019-05-01 NOTE — Telephone Encounter (Signed)
Left pt msg of providers note. Asked that patient call back and confirm which pharmacy she wants

## 2019-05-01 NOTE — Telephone Encounter (Signed)
Patient called back, states that [facility] stopped accepting the Good RX for this in July, so now [facility] is cheaper. I have called and cancelled all Adderall prescriptions that were at  [facility]  Please send new RXs to [facility]

## 2019-06-04 ENCOUNTER — Other Ambulatory Visit: Payer: Self-pay

## 2019-06-04 DIAGNOSIS — F988 Other specified behavioral and emotional disorders with onset usually occurring in childhood and adolescence: Secondary | ICD-10-CM

## 2019-06-04 NOTE — Telephone Encounter (Signed)
Tarae states she needs a refill on Adderall. All the other prescription that went to [facility] are inactive, per patient. She would like it to go to [facility] because [facility] will not except Good Rx cards.

## 2019-06-05 MED ORDER — AMPHETAMINE-DEXTROAMPHETAMINE 30 MG PO TABS: 30.0000 mg | ORAL_TABLET | Freq: Two times a day (BID) | ORAL | 0 refills | Status: DC

## 2019-06-05 NOTE — Addendum Note (Signed)
Addended by: Narda Rutherford on: 06/05/2019 09:12 AM   Modules accepted: Orders

## 2019-06-05 NOTE — Addendum Note (Signed)
Addended by: Maryla Morrow on: 06/05/2019 12:04 PM   Modules accepted: Orders

## 2019-06-05 NOTE — Telephone Encounter (Signed)
I don't think the Adderall was sent.

## 2019-06-05 NOTE — Telephone Encounter (Signed)
Sent for real this time to [facility]

## 2019-07-08 ENCOUNTER — Other Ambulatory Visit: Payer: Self-pay | Admitting: Osteopathic Medicine

## 2019-07-08 DIAGNOSIS — F988 Other specified behavioral and emotional disorders with onset usually occurring in childhood and adolescence: Secondary | ICD-10-CM

## 2019-07-08 MED ORDER — AMPHETAMINE-DEXTROAMPHETAMINE 30 MG PO TABS: 30.0000 mg | ORAL_TABLET | Freq: Two times a day (BID) | ORAL | 0 refills | Status: DC

## 2019-07-08 NOTE — Telephone Encounter (Signed)
Patient called and is needing a refill of her Adderall and would like it sent to [facility]. I see last refill was sent 06/05/2019. She was last seen in 03/2019. Please advise.

## 2019-07-16 ENCOUNTER — Ambulatory Visit (INDEPENDENT_AMBULATORY_CARE_PROVIDER_SITE_OTHER): Payer: Medicaid Other | Admitting: Osteopathic Medicine

## 2019-07-16 ENCOUNTER — Encounter: Payer: Self-pay | Admitting: Osteopathic Medicine

## 2019-07-16 ENCOUNTER — Telehealth: Payer: Self-pay

## 2019-07-16 DIAGNOSIS — J302 Other seasonal allergic rhinitis: Secondary | ICD-10-CM

## 2019-07-16 DIAGNOSIS — J019 Acute sinusitis, unspecified: Secondary | ICD-10-CM

## 2019-07-16 MED ORDER — IPRATROPIUM BROMIDE 0.06 % NA SOLN: 2.0000 | Freq: Four times a day (QID) | NASAL | 1 refills | Status: DC

## 2019-07-16 MED ORDER — CEFUROXIME AXETIL 500 MG PO TABS: 500.0000 mg | ORAL_TABLET | Freq: Two times a day (BID) | ORAL | 0 refills | Status: DC

## 2019-07-16 NOTE — Telephone Encounter (Signed)
Patient called stating that she has court tomorrow and she needs a copy of her office notes from today faxed to her Sabrina Barrera, at [phone].  Fax sent and confirmation received

## 2019-07-16 NOTE — Patient Instructions (Signed)
Plan:  I think you probably have a bacterial sinus infection, likely brought on by increased mucus production and sinuses from seasonal allergy issues.  I have sent antibiotics and a nasal spray into the pharmacy which should hopefully help.  See below for list of other over-the-counter remedies which might be helpful, it is safe to take all of these together!  If you tend to have allergy/sinus issues a certain time of year, it is worth starting a daily antihistamine pill, plus or minus a nasal steroid spray such as Flonase or Nasonex or their generics.  Will hopefully help prevent severe allergies/bacterial sinus infection.  Unfortunately, in the context of the Covid pandemic, with the numbers getting worse I absolutely do not recommend participating in large social gatherings for any reason, whether or not someone is sick.  I do not think it is a great idea to attend the birthday party this weekend.      Over-the-Counter Medications & Home Remedies for Upper Respiratory Illness  Note: the following list assumes no pregnancy, normal liver & kidney function and no other drug interactions. Dr. Sheppard Coil has highlighted medications which are safe for you to use, but these may not be appropriate for everyone. Always ask a pharmacist or qualified medical provider if you have any questions!   Aches/Pains, Fever, Headache Acetaminophen (Tylenol) 500 mg tablets - take max 2 tablets (1000 mg) every 6 hours (4 times per day)  Ibuprofen (Motrin) 200 mg tablets - take max 4 tablets (800 mg) every 6 hours*  Sinus Congestion Prescription Atrovent as directed Nasal Saline if desired Oxymetolazone (Afrin, others) sparing use due to rebound congestion, NEVER use in kids Phenylephrine (Sudafed) 10 mg tablets every 4 hours (or the 12-hour formulation)* Diphenhydramine (Benadryl) 25 mg tablets - take max 2 tablets every 4 hours  Cough & Sore Throat Dextromethorphan (Robitussin, others) - cough  suppressant Guaifenesin (Robitussin, Mucinex, others) - expectorant (helps cough up mucus) (Dextromethorphan and Guaifenesin also come in a combination tablet) Lozenges w/ Benzocaine + Menthol (Cepacol) Honey - as much as you want! Teas which "coat the throat" - look for ingredients Elm Bark, Licorice Root, Marshmallow Root  Other Antibiotics if these are prescribed - take ALL, even if you're feeling better  Zinc Lozenges within 24 hours of symptoms onset - mixed evidence this shortens the duration of the common cold Don't waste your money on Vitamin C or Echinacea  *Caution in patients with high blood pressure

## 2019-07-16 NOTE — Progress Notes (Signed)
Virtual Visit via phone  I connected with      Sabrina Barrera on 07/16/19 at 7:16 AM  by a telemedicine application and verified that I am speaking with the correct person using two identifiers.  Patient is at home I am in office   I discussed the limitations of evaluation and management by telemedicine and the availability of in person appointments. The patient expressed understanding and agreed to proceed.  History of Present Illness: Sabrina Barrera is a 48 y.o. female who would like to discuss Sinus problem   . Location/Quality: sinus pressure, started w/ eyes itching, nasal congestion, worse on the left with congestion and right side was dripping a lot. Outside of nose was raw. Drainage now has a bad smell and bas taste.  . Severity: worse starting yesterday  . Duration: 1 week . Modifying factors: sudafed, Nyquil not too helpful . Assoc signs/symptoms: No fever, no coughing until now      Observations/Objective: There were no vitals taken for this visit. BP Readings from Last 3 Encounters:  12/11/18 125/70  09/15/18 116/74  08/11/18 129/84   Exam: Normal Speech.    Lab and Radiology Results No results found for this or any previous visit (from the past 72 hour(s)). No results found.     Assessment and Plan: 48 y.o. female with The primary encounter diagnosis was Acute non-recurrent sinusitis, unspecified location. A diagnosis of Seasonal allergies was also pertinent to this visit.   PDMP not reviewed this encounter. No orders of the defined types were placed in this encounter.  Meds ordered this encounter  Medications  . ipratropium (ATROVENT) 0.06 % nasal spray    Sig: Place 2 sprays into both nostrils 4 (four) times daily. As needed for sinus congestion    Dispense:  15 mL    Refill:  1  . cefUROXime (CEFTIN) 500 MG tablet    Sig: Take 1 tablet (500 mg total) by mouth 2 (two) times daily with a meal.    Dispense:  14 tablet    Refill:  0    Patient Instructions  Plan:  I think you probably have a bacterial sinus infection, likely brought on by increased mucus production and sinuses from seasonal allergy issues.  I have sent antibiotics and a nasal spray into the pharmacy which should hopefully help.  See below for list of other over-the-counter remedies which might be helpful, it is safe to take all of these together!  If you tend to have allergy/sinus issues a certain time of year, it is worth starting a daily antihistamine pill, plus or minus a nasal steroid spray such as Flonase or Nasonex or their generics.  Will hopefully help prevent severe allergies/bacterial sinus infection.  Unfortunately, in the context of the Covid pandemic, with the numbers getting worse I absolutely do not recommend participating in large social gatherings for any reason, whether or not someone is sick.  I do not think it is a great idea to attend the birthday party this weekend.      Over-the-Counter Medications & Home Remedies for Upper Respiratory Illness  Note: the following list assumes no pregnancy, normal liver & kidney function and no other drug interactions. Dr. Lyn HollingsheadAlexander has highlighted medications which are safe for you to use, but these may not be appropriate for everyone. Always ask a pharmacist or qualified medical provider if you have any questions!   Aches/Pains, Fever, Headache Acetaminophen (Tylenol) 500 mg tablets - take max 2  tablets (1000 mg) every 6 hours (4 times per day)  Ibuprofen (Motrin) 200 mg tablets - take max 4 tablets (800 mg) every 6 hours*  Sinus Congestion Prescription Atrovent as directed Nasal Saline if desired Oxymetolazone (Afrin, others) sparing use due to rebound congestion, NEVER use in kids Phenylephrine (Sudafed) 10 mg tablets every 4 hours (or the 12-hour formulation)* Diphenhydramine (Benadryl) 25 mg tablets - take max 2 tablets every 4 hours  Cough & Sore Throat Dextromethorphan (Robitussin,  others) - cough suppressant Guaifenesin (Robitussin, Mucinex, others) - expectorant (helps cough up mucus) (Dextromethorphan and Guaifenesin also come in a combination tablet) Lozenges w/ Benzocaine + Menthol (Cepacol) Honey - as much as you want! Teas which "coat the throat" - look for ingredients Elm Bark, Licorice Root, Marshmallow Root  Other Antibiotics if these are prescribed - take ALL, even if you're feeling better  Zinc Lozenges within 24 hours of symptoms onset - mixed evidence this shortens the duration of the common cold Don't waste your money on Vitamin C or Echinacea  *Caution in patients with high blood pressure      Instructions sent via MyChart. If MyChart not available, pt was given option for info via personal e-mail w/ no guarantee of protected health info over unsecured e-mail communication, and MyChart sign-up instructions were sent to patient.   Follow Up Instructions: No follow-ups on file.    I discussed the assessment and treatment plan with the patient. The patient was provided an opportunity to ask questions and all were answered. The patient agreed with the plan and demonstrated an understanding of the instructions.   The patient was advised to call back or seek an in-person evaluation if any new concerns, if symptoms worsen or if the condition fails to improve as anticipated.  25 minutes of non-face-to-face time was provided during this encounter.      . . . . . . . . . . . . . [name]                   Historical information moved to improve visibility of documentation.  Past Medical History:  Diagnosis Date  . ADD (attention deficit disorder)   . Bacterial vaginosis   . Panic attack    Past Surgical History:  Procedure Laterality Date  . CESAREAN SECTION     Social History   Tobacco Use  . Smoking status: Current Every Day Smoker    Packs/day: 1.00    Types: Cigarettes  . Smokeless tobacco: Never Used   Substance Use Topics  . Alcohol use: Yes    Alcohol/week: 1.0 standard drinks    Types: 1 Standard drinks or equivalent per week    Comment: once a month   family history includes Alcohol abuse in her father; Cancer in her mother; Hypertension in her mother.  Medications: Current Outpatient Medications  Medication Sig Dispense Refill  . amphetamine-dextroamphetamine (ADDERALL) 30 MG tablet Take 1 tablet by mouth 2 (two) times daily. 60 tablet 0  . cefUROXime (CEFTIN) 500 MG tablet Take 1 tablet (500 mg total) by mouth 2 (two) times daily with a meal. 14 tablet 0  . escitalopram (LEXAPRO) 20 MG tablet Take 0.5 tablets (10 mg total) by mouth at bedtime. Please run with goodrx coupon thanks! 90 tablet 1  . hydrOXYzine (VISTARIL) 25 MG capsule Take by mouth.    [name] ipratropium (ATROVENT) 0.06 % nasal spray Place 2 sprays into both nostrils 4 (four) times daily. As needed for sinus congestion  15 mL 1  . Multiple Vitamin (THERA) TABS Take by mouth.     No current facility-administered medications for this visit.    Allergies  Allergen Reactions  . Azithromycin Other (See Comments)    Patient reports having hallucinations and being seen in the ED.  [name] Penicillins Other (See Comments) and Rash    Patient does not recall reactions as she has not had PCN since she was a child.  . Wellbutrin [Bupropion] Other (See Comments)    headache

## 2019-07-28 ENCOUNTER — Telehealth: Payer: Self-pay | Admitting: Osteopathic Medicine

## 2019-07-28 NOTE — Telephone Encounter (Signed)
Patient was seen about a week ago. She reports still having "the bad smell in her nose, sinus pressure and drainage. She reports the ABT is not working. Do you have any other recommendations? Please advise.

## 2019-07-29 MED ORDER — DOXYCYCLINE HYCLATE 100 MG PO TABS: 100.0000 mg | ORAL_TABLET | Freq: Two times a day (BID) | ORAL | 0 refills | Status: DC

## 2019-07-29 NOTE — Telephone Encounter (Signed)
I sent a different antibiotic into the pharmacy.  If this is not helpful, she should let us know.  Sent to the [facility] on file in [place]

## 2019-07-29 NOTE — Telephone Encounter (Signed)
Pt advised.

## 2019-08-05 ENCOUNTER — Other Ambulatory Visit: Payer: Self-pay | Admitting: Osteopathic Medicine

## 2019-08-05 DIAGNOSIS — F988 Other specified behavioral and emotional disorders with onset usually occurring in childhood and adolescence: Secondary | ICD-10-CM

## 2019-08-05 MED ORDER — AMPHETAMINE-DEXTROAMPHETAMINE 30 MG PO TABS: 30.0000 mg | ORAL_TABLET | Freq: Two times a day (BID) | ORAL | 0 refills | Status: DC

## 2019-08-05 NOTE — Telephone Encounter (Signed)
Patient called and stated that her refill was due for 08/06/2019. She is in need of her Adderall refilled to [facility]. Please advise.

## 2019-08-05 NOTE — Telephone Encounter (Signed)
Left brief VM for patient that her medication has been sent to the pharmacy. Patient was asked to call back with questions.

## 2019-09-03 ENCOUNTER — Other Ambulatory Visit: Payer: Self-pay

## 2019-09-03 DIAGNOSIS — F988 Other specified behavioral and emotional disorders with onset usually occurring in childhood and adolescence: Secondary | ICD-10-CM

## 2019-09-03 MED ORDER — AMPHETAMINE-DEXTROAMPHETAMINE 30 MG PO TABS: 30.0000 mg | ORAL_TABLET | Freq: Two times a day (BID) | ORAL | 0 refills | Status: DC

## 2019-09-03 NOTE — Telephone Encounter (Signed)
Sabrina Barrera will be due for a follow up next month.

## 2019-10-06 ENCOUNTER — Ambulatory Visit (INDEPENDENT_AMBULATORY_CARE_PROVIDER_SITE_OTHER): Payer: Medicaid Other | Admitting: Osteopathic Medicine

## 2019-10-06 DIAGNOSIS — G8929 Other chronic pain: Secondary | ICD-10-CM

## 2019-10-06 DIAGNOSIS — M545 Low back pain: Secondary | ICD-10-CM

## 2019-10-06 DIAGNOSIS — F988 Other specified behavioral and emotional disorders with onset usually occurring in childhood and adolescence: Secondary | ICD-10-CM

## 2019-10-06 DIAGNOSIS — Z79899 Other long term (current) drug therapy: Secondary | ICD-10-CM

## 2019-10-06 MED ORDER — ESCITALOPRAM OXALATE 20 MG PO TABS: 10.0000 mg | ORAL_TABLET | Freq: Every day | ORAL | 3 refills | Status: DC

## 2019-10-06 MED ORDER — AMPHETAMINE-DEXTROAMPHETAMINE 30 MG PO TABS: 30.0000 mg | ORAL_TABLET | Freq: Two times a day (BID) | ORAL | 0 refills | Status: DC

## 2019-10-06 NOTE — Progress Notes (Signed)
Virtual Visit via Video (App used: Doximity) Note  I connected with      AIRYANA SPRUNGER on 10/06/19 at 3:22 PM  by a telemedicine application and verified that I am speaking with the correct person using two identifiers.  Patient is at home I am in office   I discussed the limitations of evaluation and management by telemedicine and the availability of in person appointments. The patient expressed understanding and agreed to proceed.  History of Present Illness: Nilsa Macht Treichler is a 49 y.o. female who would like to discuss ADD med refills     Doing well, needs refills, no insomnia, chest pain, anxiety.  Metnal health doing well    Observations/Objective: There were no vitals taken for this visit. BP Readings from Last 3 Encounters:  12/11/18 125/70  09/15/18 116/74  08/11/18 129/84   Exam: Normal Speech.  NAD  Lab and Radiology Results No results found for this or any previous visit (from the past 72 hour(s)). No results found.     Assessment and Plan: 49 y.o. female with The primary encounter diagnosis was Chronic bilateral low back pain without sciatica. Diagnoses of Medication management and Attention deficit disorder (ADD) in adult were also pertinent to this visit.   PDMP not reviewed this encounter. Orders Placed This Encounter  Procedures  . DG Lumbar Spine Complete    Order Specific Question:   Reason for Exam (SYMPTOM  OR DIAGNOSIS REQUIRED)    Answer:   lower back pain    Order Specific Question:   Is patient pregnant?    Answer:   No    Order Specific Question:   Preferred imaging location?    [facility]    Order Specific Question:   Radiology Contrast Protocol - do NOT remove file path    Answer:   [email or web address]  . COMPLETE METABOLIC PANEL WITH GFR   Meds ordered this encounter  Medications  . escitalopram (LEXAPRO) 20 MG tablet    Sig: Take 0.5 tablets (10 mg total) by mouth  at bedtime. Please run with goodrx coupon thanks!    Dispense:  90 tablet    Refill:  3  . amphetamine-dextroamphetamine (ADDERALL) 30 MG tablet    Sig: Take 1 tablet by mouth 2 (two) times daily.    Dispense:  60 tablet    Refill:  0    Please run w/ goodrx coupon, thanks!  [name] amphetamine-dextroamphetamine (ADDERALL) 30 MG tablet    Sig: Take 1 tablet by mouth 2 (two) times daily.    Dispense:  60 tablet    Refill:  0  . amphetamine-dextroamphetamine (ADDERALL) 30 MG tablet    Sig: Take 1 tablet by mouth 2 (two) times daily.    Dispense:  60 tablet    Refill:  0     Follow Up Instructions: Return in about 6 months (around 04/04/2020) for ANNUAL (call week prior to visit for lab orders).    I discussed the assessment and treatment plan with the patient. The patient was provided an opportunity to ask questions and all were answered. The patient agreed with the plan and demonstrated an understanding of the instructions.   The patient was advised to call back or seek an in-person evaluation if any new concerns, if symptoms worsen or if the condition fails to improve as anticipated.  15 minutes of non-face-to-face time was provided during this encounter.      . . . . . . . . . . . . . [name]  Historical information moved to improve visibility of documentation.  Past Medical History:  Diagnosis Date  . ADD (attention deficit disorder)   . Bacterial vaginosis   . Panic attack    Past Surgical History:  Procedure Laterality Date  . CESAREAN SECTION     Social History   Tobacco Use  . Smoking status: Current Every Day Smoker    Packs/day: 1.00    Types: Cigarettes  . Smokeless tobacco: Never Used  Substance Use Topics  . Alcohol use: Yes    Alcohol/week: 1.0 standard drinks    Types: 1 Standard drinks or equivalent per week    Comment: once a month   family history includes Alcohol abuse in her father; Cancer in her mother;  Hypertension in her mother.  Medications: Current Outpatient Medications  Medication Sig Dispense Refill  . amphetamine-dextroamphetamine (ADDERALL) 30 MG tablet Take 1 tablet by mouth 2 (two) times daily. 60 tablet 0  . [START ON 11/05/2019] amphetamine-dextroamphetamine (ADDERALL) 30 MG tablet Take 1 tablet by mouth 2 (two) times daily. 60 tablet 0  . [START ON 12/05/2019] amphetamine-dextroamphetamine (ADDERALL) 30 MG tablet Take 1 tablet by mouth 2 (two) times daily. 60 tablet 0  . escitalopram (LEXAPRO) 20 MG tablet Take 0.5 tablets (10 mg total) by mouth at bedtime. Please run with goodrx coupon thanks! 90 tablet 3  . hydrOXYzine (VISTARIL) 25 MG capsule Take by mouth.    [name] ipratropium (ATROVENT) 0.06 % nasal spray Place 2 sprays into both nostrils 4 (four) times daily. As needed for sinus congestion 15 mL 1  . Multiple Vitamin (THERA) TABS Take by mouth.     No current facility-administered medications for this visit.   Allergies  Allergen Reactions  . Azithromycin Other (See Comments)    Patient reports having hallucinations and being seen in the ED.  [name] Penicillins Other (See Comments) and Rash    Patient does not recall reactions as she has not had PCN since she was a child.  . Wellbutrin [Bupropion] Other (See Comments)    headache

## 2019-10-08 ENCOUNTER — Encounter: Payer: Self-pay | Admitting: Osteopathic Medicine

## 2019-10-08 MED ORDER — AMPHETAMINE-DEXTROAMPHETAMINE 30 MG PO TABS: 30.0000 mg | ORAL_TABLET | Freq: Two times a day (BID) | ORAL | 0 refills | Status: DC

## 2020-01-20 ENCOUNTER — Telehealth (INDEPENDENT_AMBULATORY_CARE_PROVIDER_SITE_OTHER): Payer: Medicaid Other | Admitting: Osteopathic Medicine

## 2020-01-20 VITALS — Wt 160.0 lb

## 2020-01-20 DIAGNOSIS — Z79899 Other long term (current) drug therapy: Secondary | ICD-10-CM

## 2020-01-20 NOTE — Progress Notes (Signed)
Called pt at 813 am, no answer. Went straight to vm. Per pt's vm "Please don't leave a msg, send text."

## 2020-01-20 NOTE — Progress Notes (Signed)
Called 01/20/20 8:28 AM and went to voicemail Will try again later today  Called again at 12:10 PM and no answer Text sent to reschedule

## 2020-01-28 ENCOUNTER — Telehealth (INDEPENDENT_AMBULATORY_CARE_PROVIDER_SITE_OTHER): Payer: Self-pay | Admitting: Osteopathic Medicine

## 2020-01-28 ENCOUNTER — Encounter: Payer: Self-pay | Admitting: Osteopathic Medicine

## 2020-01-28 VITALS — Temp 97.0°F | Wt 160.0 lb

## 2020-01-28 DIAGNOSIS — F988 Other specified behavioral and emotional disorders with onset usually occurring in childhood and adolescence: Secondary | ICD-10-CM

## 2020-01-28 DIAGNOSIS — Z1231 Encounter for screening mammogram for malignant neoplasm of breast: Secondary | ICD-10-CM

## 2020-01-28 DIAGNOSIS — Z0000 Encounter for general adult medical examination without abnormal findings: Secondary | ICD-10-CM

## 2020-01-28 MED ORDER — AMPHETAMINE-DEXTROAMPHETAMINE 30 MG PO TABS: 30.0000 mg | ORAL_TABLET | Freq: Two times a day (BID) | ORAL | 0 refills | Status: DC

## 2020-01-28 NOTE — Progress Notes (Signed)
Virtual Visit via Phone  I connected with      Sabrina Barrera on 01/28/20 at 1:02 PM  by a telemedicine application and verified that I am speaking with the correct person using two identifiers.  Patient is at home I am in office   I discussed the limitations of evaluation and management by telemedicine and the availability of in person appointments. The patient expressed understanding and agreed to proceed.  History of Present Illness: Sabrina Barrera is a 49 y.o. female who would like to discuss ADD refills    Doing well on current meds  Getting over COVID infection Will be able to get labs on Monday     Observations/Objective: Temp (!) 97 F (36.1 C)   Wt 160 lb (72.6 kg)   BMI 25.06 kg/m  BP Readings from Last 3 Encounters:  12/11/18 125/70  09/15/18 116/74  08/11/18 129/84   Exam: Normal Speech.  NAD  Lab and Radiology Results No results found for this or any previous visit (from the past 72 hour(s)). No results found.     Assessment and Plan: 49 y.o. female with The primary encounter diagnosis was Annual physical exam. Diagnoses of Attention deficit disorder (ADD) in adult and Breast cancer screening by mammogram were also pertinent to this visit.   PDMP not reviewed this encounter. Orders Placed This Encounter  Procedures  . MM 3D SCREEN BREAST BILATERAL    Order Specific Question:   Reason for Exam (SYMPTOM  OR DIAGNOSIS REQUIRED)    Answer:   screen    Order Specific Question:   Is the patient pregnant?    Answer:   No    Order Specific Question:   Preferred imaging location?    [facility]  . CBC  . COMPLETE METABOLIC PANEL WITH GFR  . Lipid panel   Meds ordered this encounter  Medications  . amphetamine-dextroamphetamine (ADDERALL) 30 MG tablet    Sig: Take 1 tablet by mouth 2 (two) times daily.    Dispense:  60 tablet    Refill:  0  . amphetamine-dextroamphetamine (ADDERALL) 30 MG tablet    Sig: Take 1 tablet by  mouth 2 (two) times daily.    Dispense:  60 tablet    Refill:  0  . amphetamine-dextroamphetamine (ADDERALL) 30 MG tablet    Sig: Take 1 tablet by mouth 2 (two) times daily.    Dispense:  60 tablet    Refill:  0    Please run w/ goodrx coupon, thanks!   There are no Patient Instructions on file for this visit.  Instructions sent via MyChart. If MyChart not available, pt was given option for info via personal e-mail w/ no guarantee of protected health info over unsecured e-mail communication, and MyChart sign-up instructions were sent to patient.   Follow Up Instructions: Return in about 6 months (around 07/29/2020) for [employer].    I discussed the assessment and treatment plan with the patient. The patient was provided an opportunity to ask questions and all were answered. The patient agreed with the plan and demonstrated an understanding of the instructions.   The patient was advised to call back or seek an in-person evaluation if any new concerns, if symptoms worsen or if the condition fails to improve as anticipated.  21 minutes of non-face-to-face time was provided during this encounter.      . . . . . . . . . . . . . [name]  Historical information moved to improve visibility of documentation.  Past Medical History:  Diagnosis Date  . ADD (attention deficit disorder)   . Bacterial vaginosis   . Panic attack    Past Surgical History:  Procedure Laterality Date  . CESAREAN SECTION     Social History   Tobacco Use  . Smoking status: Current Every Day Smoker    Packs/day: 1.00    Types: Cigarettes  . Smokeless tobacco: Never Used  Substance Use Topics  . Alcohol use: Yes    Alcohol/week: 1.0 standard drinks    Types: 1 Standard drinks or equivalent per week    Comment: once a month   family history includes Alcohol abuse in her father; Cancer in her mother; Hypertension in her mother.  Medications: Current  Outpatient Medications  Medication Sig Dispense Refill  . escitalopram (LEXAPRO) 20 MG tablet Take 0.5 tablets (10 mg total) by mouth at bedtime. Please run with goodrx coupon thanks! 90 tablet 3  . hydrOXYzine (VISTARIL) 25 MG capsule Take by mouth.    . Multiple Vitamin (THERA) TABS Take by mouth.    Melene Muller ON 03/28/2020] amphetamine-dextroamphetamine (ADDERALL) 30 MG tablet Take 1 tablet by mouth 2 (two) times daily. 60 tablet 0  . [START ON 02/27/2020] amphetamine-dextroamphetamine (ADDERALL) 30 MG tablet Take 1 tablet by mouth 2 (two) times daily. 60 tablet 0  . amphetamine-dextroamphetamine (ADDERALL) 30 MG tablet Take 1 tablet by mouth 2 (two) times daily. 60 tablet 0   No current facility-administered medications for this visit.   Allergies  Allergen Reactions  . Azithromycin Other (See Comments)    Patient reports having hallucinations and being seen in the ED.  [name] Penicillins Other (See Comments) and Rash    Patient does not recall reactions as she has not had PCN since she was a child.  . Zinc Other (See Comments)    Hallucinations  . Wellbutrin [Bupropion] Other (See Comments)    headache

## 2020-01-28 NOTE — Progress Notes (Signed)
Attempted to call patient for video visit.  Patient called at 11:30 am

## 2020-04-29 ENCOUNTER — Other Ambulatory Visit: Payer: Self-pay | Admitting: Osteopathic Medicine

## 2020-04-29 MED ORDER — AMPHETAMINE-DEXTROAMPHETAMINE 30 MG PO TABS: 30.0000 mg | ORAL_TABLET | Freq: Two times a day (BID) | ORAL | 0 refills | Status: DC

## 2020-04-29 NOTE — Telephone Encounter (Signed)
Routing to covering provider. Rx pended.  

## 2020-04-29 NOTE — Telephone Encounter (Signed)
Task completed. Pt has been updated of med refill sent to the pharmacy. Pt has confirmed to keep her upcoming appointment with provider. No other inquiries during the call.

## 2020-04-29 NOTE — Telephone Encounter (Signed)
Pt called in to schedule her 3 month med f/u. She then states that she is in need of a refill on Adderall. I scheduled her f/u for 09/14 @ 1050. Pharmacy on file is correct

## 2020-05-10 ENCOUNTER — Ambulatory Visit: Payer: Medicaid Other | Admitting: Osteopathic Medicine

## 2020-05-16 ENCOUNTER — Ambulatory Visit: Payer: Medicaid Other | Admitting: Osteopathic Medicine

## 2020-05-27 ENCOUNTER — Ambulatory Visit (INDEPENDENT_AMBULATORY_CARE_PROVIDER_SITE_OTHER): Payer: Self-pay | Admitting: Nurse Practitioner

## 2020-05-27 ENCOUNTER — Encounter: Payer: Self-pay | Admitting: Nurse Practitioner

## 2020-05-27 ENCOUNTER — Other Ambulatory Visit: Payer: Self-pay

## 2020-05-27 VITALS — BP 112/77 | HR 95 | Temp 98.1°F | Ht 67.0 in | Wt 153.2 lb

## 2020-05-27 DIAGNOSIS — K5792 Diverticulitis of intestine, part unspecified, without perforation or abscess without bleeding: Secondary | ICD-10-CM | POA: Insufficient documentation

## 2020-05-27 MED ORDER — DICYCLOMINE HCL 10 MG PO CAPS: 10.0000 mg | ORAL_CAPSULE | Freq: Three times a day (TID) | ORAL | 3 refills | Status: DC

## 2020-05-27 MED ORDER — AMOXICILLIN-POT CLAVULANATE 875-125 MG PO TABS: 1.0000 | ORAL_TABLET | Freq: Two times a day (BID) | ORAL | 0 refills | Status: DC

## 2020-05-27 NOTE — Patient Instructions (Addendum)
BRAT Diet for the next 5 days Bananas  Rice Applesauce Toast  Drink plenty of water Avoid laxatives  Take the dicyclomine before every meal for the next 5 days- then may go to as needed.    Diverticulitis  Diverticulitis is when small pockets in your large intestine (colon) get infected or swollen. This causes stomach pain and watery poop (diarrhea). These pouches are called diverticula. They form in people who have a condition called diverticulosis. Follow these instructions at home: Medicines  Take over-the-counter and prescription medicines only as told by your doctor. These include: ? Antibiotics. ? Pain medicines. ? Fiber pills. ? Probiotics. ? Stool softeners.  Do not drive or use heavy machinery while taking prescription pain medicine.  If you were prescribed an antibiotic, take it as told. Do not stop taking it even if you feel better. General instructions   Follow a diet as told by your doctor.  When you feel better, your doctor may tell you to change your diet. You may need to eat a lot of fiber. Fiber makes it easier to poop (have bowel movements). Healthy foods with fiber include: ? Berries. ? Beans. ? Lentils. ? Green vegetables.  Exercise 3 or more times a week. Aim for 30 minutes each time. Exercise enough to sweat and make your heart beat faster.  Keep all follow-up visits as told. This is important. You may need to have an exam of the large intestine. This is called a colonoscopy. Contact a doctor if:  Your pain does not get better.  You have a hard time eating or drinking.  You are not pooping like normal. Get help right away if:  Your pain gets worse.  Your problems do not get better.  Your problems get worse very fast.  You have a fever.  You throw up (vomit) more than one time.  You have poop that is: ? Bloody. ? Black. ? Tarry. Summary  Diverticulitis is when small pockets in your large intestine (colon) get infected or  swollen.  Take medicines only as told by your doctor.  Follow a diet as told by your doctor. This information is not intended to replace advice given to you by your health care provider. Make sure you discuss any questions you have with your health care provider. Document Revised: 07/26/2017 Document Reviewed: 08/30/2016 Elsevier Patient Education  2020 [employer].

## 2020-05-27 NOTE — Progress Notes (Signed)
Acute Office Visit  Subjective:    Patient ID: Sabrina Barrera, female    DOB: 1971/07/28, 49 y.o.   MRN: [id number]  Chief Complaint  Patient presents with  . Abdominal Pain    hx of diverticulitis, has had constipation/diarrhea, was off work Games developer and Wed, tried going back to work yesterday and after eating she started having abd pain again, asked EMS to be called for her at work, did not go to ER, used to be on [employer] for "spasms" but has not taken it in a year    HPI Sabrina Barrera is a 49 year old female presenting today with intermittent abdominal pain and diarrhea.  She reports that she does have a history of diverticulitis and feels that her symptoms are consistent with what she has experienced in the past.   She reports on Monday of this week she started to experience severe gas type pain in her abdomen and had an episode of incontinence of loose stool.  She reports that her symptoms required her to stay out of work until yesterday however when she went back, she started to experience crampy abdominal pain with an episode of nausea and sweating and nearly passed out. Her boss did call EMS and she was evaluated however did not go to the hospital.  She reports the pain is exacerbated with eating and drinking.  She reports that she did think that she possibly had a bowel obstruction and therefore drank 2 bottles of magnesium citrate prior to the episode of abdominal pain yesterday.  She states that EMS felt that the laxatives had caused the symptoms.  She does endorse a history of a colonoscopy in the distant past.  She currently does not have insurance coverage and therefore this is not an option for her right now.  She would like to start with conservative management due to limited insurance coverage but is willing to evaluate further if conservative measures are not effective.  She denies any known fevers, palpitations, blood in the stool, urinary symptoms, or vomiting.  She endorses  cramping type abdominal pain with tenderness near the right upper quadrant and left upper quadrant with palpation, loose stool, nausea.   Past Medical History:  Diagnosis Date  . ADD (attention deficit disorder)   . Bacterial vaginosis   . Panic attack     Past Surgical History:  Procedure Laterality Date  . CESAREAN SECTION      Family History  Problem Relation Age of Onset  . Cancer Mother   . Hypertension Mother   . Alcohol abuse Father     Social History   Socioeconomic History  . Marital status: Divorced    Spouse name: Not on file  . Number of children: Not on file  . Years of education: Not on file  . Highest education level: Not on file  Occupational History  . Not on file  Tobacco Use  . Smoking status: Current Every Day Smoker    Packs/day: 1.00    Types: Cigarettes  . Smokeless tobacco: Never Used  Substance and Sexual Activity  . Alcohol use: Yes    Alcohol/week: 1.0 standard drink    Types: 1 Standard drinks or equivalent per week    Comment: once a month  . Drug use: No  . Sexual activity: Not on file  Other Topics Concern  . Not on file  Social History Narrative  . Not on file   Social Determinants of Health   Financial Resource Strain:   .  Difficulty of Paying Living Expenses: Not on file  Food Insecurity:   . Worried About Charity fundraiser in the Last Year: Not on file  . Ran Out of Food in the Last Year: Not on file  Transportation Needs:   . Lack of Transportation (Medical): Not on file  . Lack of Transportation (Non-Medical): Not on file  Physical Activity:   . Days of Exercise per Week: Not on file  . Minutes of Exercise per Session: Not on file  Stress:   . Feeling of Stress : Not on file  Social Connections:   . Frequency of Communication with Friends and Family: Not on file  . Frequency of Social Gatherings with Friends and Family: Not on file  . Attends Religious Services: Not on file  . Active Member of Clubs or  Organizations: Not on file  . Attends Archivist Meetings: Not on file  . Marital Status: Not on file  Intimate Partner Violence:   . Fear of Current or Ex-Partner: Not on file  . Emotionally Abused: Not on file  . Physically Abused: Not on file  . Sexually Abused: Not on file    Outpatient Medications Prior to Visit  Medication Sig Dispense Refill  . amphetamine-dextroamphetamine (ADDERALL) 30 MG tablet Take 1 tablet by mouth 2 (two) times daily. 60 tablet 0  . amphetamine-dextroamphetamine (ADDERALL) 30 MG tablet Take 1 tablet by mouth 2 (two) times daily. 60 tablet 0  . amphetamine-dextroamphetamine (ADDERALL) 30 MG tablet Take 1 tablet by mouth 2 (two) times daily. 30 tablet 0  . escitalopram (LEXAPRO) 20 MG tablet Take 0.5 tablets (10 mg total) by mouth at bedtime. Please run with goodrx coupon thanks! 90 tablet 3  . hydrOXYzine (VISTARIL) 25 MG capsule Take by mouth. (Patient not taking: Reported on 05/27/2020)    . Multiple Vitamin (THERA) TABS Take by mouth. (Patient not taking: Reported on 05/27/2020)     No facility-administered medications prior to visit.    Allergies  Allergen Reactions  . Azithromycin Other (See Comments)    Patient reports having hallucinations and being seen in the ED.  [name] Penicillins Other (See Comments) and Rash    Patient does not recall reactions as she has not had PCN since she was a child.  . Iron Nausea And Vomiting  . Zinc Other (See Comments)    Hallucinations  . Wellbutrin [Bupropion] Other (See Comments)    headache    Review of Systems See HPI    Objective:    Physical Exam Vitals and nursing note reviewed.  Constitutional:      Appearance: She is well-developed.  HENT:     Head: Normocephalic.  Eyes:     Extraocular Movements: Extraocular movements intact.     Pupils: Pupils are equal, round, and reactive to light.  Cardiovascular:     Rate and Rhythm: Normal rate and regular rhythm.     Heart sounds: Normal heart  sounds.  Pulmonary:     Effort: Pulmonary effort is normal.     Breath sounds: Normal breath sounds.  Abdominal:     General: Bowel sounds are normal.     Palpations: Abdomen is soft. There is no shifting dullness, fluid wave, hepatomegaly, splenomegaly or mass.     Tenderness: There is abdominal tenderness in the right upper quadrant and left upper quadrant. There is no right CVA tenderness, left CVA tenderness, guarding or rebound. Negative signs include Murphy's sign and McBurney's sign.  Hernia: No hernia is present.  Skin:    General: Skin is warm and dry.     Capillary Refill: Capillary refill takes less than 2 seconds.  Neurological:     General: No focal deficit present.     Mental Status: She is alert and oriented to person, place, and time.  Psychiatric:        Mood and Affect: Mood normal.        Behavior: Behavior normal.     BP 112/77   Pulse 95   Temp 98.1 F (36.7 C) (Oral)   Ht $R'5\' 7"'LH$  (1.702 m)   Wt 153 lb 3.2 oz (69.5 kg)   SpO2 99%   BMI 23.99 kg/m  Wt Readings from Last 3 Encounters:  05/27/20 153 lb 3.2 oz (69.5 kg)  01/28/20 160 lb (72.6 kg)  01/20/20 160 lb (72.6 kg)    There are no preventive care reminders to display for this patient.  There are no preventive care reminders to display for this patient.   No results found for: TSH No results found for: WBC, HGB, HCT, MCV, PLT No results found for: NA, K, CHLORIDE, CO2, GLUCOSE, BUN, CREATININE, BILITOT, ALKPHOS, AST, ALT, PROT, ALBUMIN, CALCIUM, ANIONGAP, EGFR, GFR No results found for: CHOL No results found for: HDL No results found for: LDLCALC No results found for: TRIG No results found for: CHOLHDL No results found for: HGBA1C     Assessment & Plan:   Problem List Items Addressed This Visit      Other   Diverticulitis - Primary    Symptoms and presentation consistent with diverticulitis.   She is not experiencing tenderness over the liver, gallbladder, pancreas, or spleen. Based  on her symptom description it does appear that she may have a component of irritable bowel syndrome present. Unfortunately she is without insurance right now and therefore monitoring laboratory values and testing is quite costly and not feasible at this time.  She does have current lab orders in place for a CBC and a CMP.  Recommend that she go ahead and have these drawn if possible. We will plan to treat with oral Augmentin as this is the least expensive option for antibiotic treatment.  We will also plan to restart Bentyl with recommendation to take consistently for at least the next 5 days. Recommend brat diet and increasing fluid intake. Recommend she avoid use of laxatives at this time as these seem to exacerbate her symptoms and I do feel that the overuse of laxative may have contributed to her symptoms that required EMS to be called to her place of employment. Follow-up with her PCP on Thursday next week as planned or sooner if needed for worsening or not improving symptoms.      Relevant Medications   dicyclomine (BENTYL) 10 MG capsule   amoxicillin-clavulanate (AUGMENTIN) 875-125 MG tablet       Meds ordered this encounter  Medications  . dicyclomine (BENTYL) 10 MG capsule    Sig: Take 1 capsule (10 mg total) by mouth 3 (three) times daily before meals.    Dispense:  45 capsule    Refill:  3  . amoxicillin-clavulanate (AUGMENTIN) 875-125 MG tablet    Sig: Take 1 tablet by mouth 2 (two) times daily.    Dispense:  20 tablet    Refill:  0   Follow-up next week with PCP on Thursday as planned or sooner if symptoms worsen or fail to improve.  Orma Render, NP

## 2020-05-27 NOTE — Assessment & Plan Note (Signed)
Symptoms and presentation consistent with diverticulitis.   She is not experiencing tenderness over the liver, gallbladder, pancreas, or spleen. Based on her symptom description it does appear that she may have a component of irritable bowel syndrome present. Unfortunately she is without insurance right now and therefore monitoring laboratory values and testing is quite costly and not feasible at this time.  She does have current lab orders in place for a CBC and a CMP.  Recommend that she go ahead and have these drawn if possible. We will plan to treat with oral Augmentin as this is the least expensive option for antibiotic treatment.  We will also plan to restart Bentyl with recommendation to take consistently for at least the next 5 days. Recommend brat diet and increasing fluid intake. Recommend she avoid use of laxatives at this time as these seem to exacerbate her symptoms and I do feel that the overuse of laxative may have contributed to her symptoms that required EMS to be called to her place of employment. Follow-up with her PCP on Thursday next week as planned or sooner if needed for worsening or not improving symptoms.

## 2020-06-02 ENCOUNTER — Ambulatory Visit (INDEPENDENT_AMBULATORY_CARE_PROVIDER_SITE_OTHER): Payer: Self-pay | Admitting: Osteopathic Medicine

## 2020-06-02 ENCOUNTER — Encounter: Payer: Self-pay | Admitting: Osteopathic Medicine

## 2020-06-02 ENCOUNTER — Other Ambulatory Visit: Payer: Self-pay

## 2020-06-02 VITALS — BP 131/86 | HR 84 | Wt 131.0 lb

## 2020-06-02 DIAGNOSIS — K5792 Diverticulitis of intestine, part unspecified, without perforation or abscess without bleeding: Secondary | ICD-10-CM

## 2020-06-02 DIAGNOSIS — F988 Other specified behavioral and emotional disorders with onset usually occurring in childhood and adolescence: Secondary | ICD-10-CM

## 2020-06-02 MED ORDER — OMEPRAZOLE 40 MG PO CPDR: 40.0000 mg | DELAYED_RELEASE_CAPSULE | Freq: Every day | ORAL | 0 refills | Status: DC

## 2020-06-02 MED ORDER — AMPHETAMINE-DEXTROAMPHETAMINE 30 MG PO TABS: 30.0000 mg | ORAL_TABLET | Freq: Two times a day (BID) | ORAL | 0 refills | Status: DC

## 2020-06-02 NOTE — Patient Instructions (Signed)
For diverticular flare: Clear liquid diet --> full liquid diet --> bland diet --> regular diet (See below)  Advance diet SLOWLY as tolerated  For acid reflux: Antacid sent to take for 6-8 weeks  See list of additional information and foods to avoid below  ADHD: Will send 6 mos of Adderall Rx to pharmacy Please see me in the office in 6 months to monitor      Clear Liquid Diet, Adult A clear liquid diet is a diet that includes only liquids and semi-liquids that you can see through. You do not eat any food on this diet. Most people need to follow this diet for only a short time. You may need to follow a clear liquid diet if:  You have a problem right before or after you have surgery.  You did not eat food for a long time.  You had any of these: ? Feeling sick to your stomach (nausea). ? Throwing up (vomiting). ? Passing a watery stool (diarrhea).  You are going to have an exam to look at parts of your digestive system.  You are going to have bowel surgery. The goals of this diet are:  To rest the stomach.  To help you clear the digestive system before an exam.  To make sure that there is enough fluid in your body.  To make sure you get some energy.  To help you get back to eating like you used to. What are tips for following this plan?  A clear liquid is a liquid or semi-liquid that you can see through when you hold it up to a light. An example of this is gelatin.  This diet does not give you all the nutrients that you need. Choose a variety of the liquids that your doctor says you can drink on this diet. That way, you will get as many nutrients as possible.  If you are not sure whether you can have certain items, ask your doctor. If you are unable to swallow a thin liquid, you will need to thicken it before taking it. This will stop you from breathing it in (aspiration). What foods should I eat?   Water and flavored water.  Fruit juices that do not have pulp,  such as cranberry juice and apple juice.  Tea and coffee without milk or cream.  Clear bouillon or broth.  Broth-based soups that have been strained.  Flavored gelatins.  Honey.  Sugar water.  Ice or frozen ice pops that do not have any milk, yogurt, fruit pieces, or fruit pulp in them.  Clear sodas.  Clear sports drinks. The items listed above may not be a complete list of what you can eat and drink. Contact a dietitian for more options. What foods should I avoid?  Juices that have pulp.  Milk.  Cream or cream-based soups.  Yogurt.  Normal foods that are not clear liquids or semi-liquids. The items listed above may not be a complete list of what you should not eat and drink. Contact a dietitian for options. Questions to ask your health care provider  How long do I need to follow this diet?  Are there any medicines that I should change while on this diet? Summary  A clear liquid diet is a diet that includes only liquids and semi-liquids that you can see through.  Some goals of this diet are to rest your stomach, make sure you get enough fluid, and give you some energy.  Avoid liquids with milk, cream,  or pulp while you are on this diet. This information is not intended to replace advice given to you by your health care provider. Make sure you discuss any questions you have with your health care provider. Document Revised: 02/03/2018 Document Reviewed: 02/03/2018 Elsevier Patient Education  2020 Elsevier Inc.     Full Liquid Diet A full liquid diet refers to fluids and foods that are liquid or will become liquid at room temperature. This diet should only be used for a short period of time to help you recover from illness or surgery. Your health care provider or dietitian will help you determine when it is safe to eat regular foods. What are tips for following this plan?     Reading food labels  Check food labels of nutrition shakes for the amount of protein.  Look for nutrition shakes that have at least 8-10 grams of protein in each serving.  Look for drinks, such as milks and juices, that are "fortified" or "enriched." This means that vitamins and minerals have been added. Shopping  Buy premade nutritional shakes to keep on hand.  To vary your choices, buy different flavors of milks and shakes. Meal planning  Choose flavors and foods that you enjoy.  To make sure you get enough energy from food (calories): ? Eat 3 full liquid meals each day. Have a liquid snack between each meal. ? Drink 6-8 ounces (177-237 ml) of a nutrition supplement shake with meals or as snacks. ? Add protein powder, powdered milk, milk, or yogurt to shakes to increase the amount of protein.  Drink at least one serving a day of citrus fruit juice or fruit juice that has vitamin C added. General guidelines  Before starting the full-liquid diet, check with your health care provider to know what foods you should avoid. These may include full-fat or high-fiber liquids.  You may have any liquid or food that becomes a liquid at room temperature. The food is considered a liquid if it can be poured off a spoon at room temperature.  Do not drink alcohol unless approved by your health care provider.  This diet gives you most of the nutrients that you need for energy, but you may not get enough of certain vitamins, minerals, and fiber. Make sure to talk to your health care provider or dietitian about: ? How many calories you need to eat get day. ? How much fluid you should have each day. ? Taking a multivitamin or a nutritional supplement. What foods are allowed? The items listed may not be a complete list. Talk with your dietitian about what dietary choices are best for you. Grains Thin hot cereal, such as cream of wheat. Soft-cooked pasta or rice pured in soup. Vegetables Pulp-free tomato or vegetable juice. Vegetables pured in soup. Fruits Fruit juice without pulp.  Strained fruit pures (seeds and skins removed). Meats and other protein foods Beef, chicken, and fish broths. Powdered protein supplements. Dairy Milk and milk-based beverages, including milk shakes and instant breakfast mixes. Smooth yogurt. Pured cottage cheese. Beverages Water. Coffee and tea (caffeinated or decaffeinated). Cocoa. Liquid nutritional supplements. Soft drinks. Nondairy milks, such as almond, coconut, rice, or soy milk. Fats and oils Melted margarine and butter. Cream. Canola, almond, avocado, corn, grapeseed, sunflower, and sesame oils. Gravy. Sweets and desserts Custard. Pudding. Flavored gelatin. Smooth ice cream (without nuts or candy pieces). Sherbet. Popsicles. [place] ice. Pudding pops. Seasoning and other foods Salt and pepper. Spices. Cocoa powder. Vinegar. Ketchup. Yellow mustard. Smooth sauces,  such as [place], cheese sauce, or white sauce. Soy sauce. Cream soups. Strained soups. Syrup. Honey. Jelly (without fruit pieces). What foods are not allowed? The items listed may not be a complete list. Talk with your dietitian about what dietary choices are best for you. Grains Whole grains. Pasta. Rice. Cold cereal. Bread. Crackers. Vegetables All whole fresh, frozen, or canned vegetables. Fruits All whole fresh, frozen, or canned fruits. Meats and other protein foods All cuts of meat, poultry, and fish. Eggs. Tofu and soy protein. Nuts and nut butters. Lunch meat. Sausage. Dairy Hard cheese. Yogurt with fruit chunks. Fats and oils Coconut oil. Palm oil. Lard. Cold butter. Sweets and desserts Ice cream or other frozen desserts that have any solids in them or on top, such as nuts, chocolate chips, and pieces of cookies. Cakes. Cookies. Candy. Seasoning and other foods Stone-ground mustards. Soups with chunks or pieces. Summary  A full liquid diet refers to fluids and foods that are liquid or will become liquid at room temperature.  This diet should only be  used for a short period of time to help you recover from illness or surgery. Ask your health care provider or dietitian when it is safe for you to eat regular foods.  To make sure you get enough calories and nutrients, eat 3 meals each day with snacks between. Drink premade nutrition supplement shakes or add protein powder to homemade shakes. Take a vitamin and mineral supplement as told by your health care provider. This information is not intended to replace advice given to you by your health care provider. Make sure you discuss any questions you have with your health care provider. Document Revised: 11/09/2017 Document Reviewed: 09/26/2016 Elsevier Patient Education  2020 [employer].    [place] Diet A bland diet consists of foods that are often soft and do not have a lot of fat, fiber, or extra seasonings. Foods without fat, fiber, or seasoning are easier for the body to digest. They are also less likely to irritate your mouth, throat, stomach, and other parts of your digestive system. A bland diet is sometimes called a BRAT diet. What is my plan? Your health care provider or food and nutrition specialist (dietitian) may recommend specific changes to your diet to prevent symptoms or to treat your symptoms. These changes may include:  Eating small meals often.  Cooking food until it is soft enough to chew easily.  Chewing your food well.  Drinking fluids slowly.  Not eating foods that are very spicy, sour, or fatty.  Not eating citrus fruits, such as oranges and grapefruit. What do I need to know about this diet?  Eat a variety of foods from the bland diet food list.  Do not follow a bland diet longer than needed.  Ask your health care provider whether you should take vitamins or supplements. What foods can I eat? Grains  Hot cereals, such as cream of wheat. Rice. Bread, crackers, or tortillas made from refined white flour. Vegetables Canned or cooked vegetables. Mashed or  boiled potatoes. Fruits  Bananas. Applesauce. Other types of cooked or canned fruit with the skin and seeds removed, such as canned peaches or pears. Meats and other proteins  Scrambled eggs. Creamy peanut butter or other nut butters. Lean, well-cooked meats, such as chicken or fish. Tofu. Soups or broths. Dairy Low-fat dairy products, such as milk, cottage cheese, or yogurt. Beverages  Water. Herbal tea. Apple juice. Fats and oils Mild salad dressings. Canola or olive oil. Sweets  and desserts Pudding. Custard. Fruit gelatin. Ice cream. The items listed above may not be a complete list of recommended foods and beverages. Contact a dietitian for more options. What foods are not recommended? Grains Whole grain breads and cereals. Vegetables Raw vegetables. Fruits Raw fruits, especially citrus, berries, or dried fruits. Dairy Whole fat dairy foods. Beverages Caffeinated drinks. Alcohol. Seasonings and condiments Strongly flavored seasonings or condiments. Hot sauce. Salsa. Other foods Spicy foods. Fried foods. Sour foods, such as pickled or fermented foods. Foods with high sugar content. Foods high in fiber. The items listed above may not be a complete list of foods and beverages to avoid. Contact a dietitian for more information. Summary  A bland diet consists of foods that are often soft and do not have a lot of fat, fiber, or extra seasonings.  Foods without fat, fiber, or seasoning are easier for the body to digest.  Check with your health care provider to see how long you should follow this diet plan. It is not meant to be followed for long periods. This information is not intended to replace advice given to you by your health care provider. Make sure you discuss any questions you have with your health care provider. Document Revised: 09/11/2017 Document Reviewed: 09/11/2017 Elsevier Patient Education  2020 Elsevier Inc.      Gastroesophageal Reflux Disease,  Adult Gastroesophageal reflux (GER) happens when acid from the stomach flows up into the tube that connects the mouth and the stomach (esophagus). Normally, food travels down the esophagus and stays in the stomach to be digested. With GER, food and stomach acid sometimes move back up into the esophagus. You may have a disease called gastroesophageal reflux disease (GERD) if the reflux:  Happens often.  Causes frequent or very bad symptoms.  Causes problems such as damage to the esophagus. When this happens, the esophagus becomes sore and swollen (inflamed). Over time, GERD can make small holes (ulcers) in the lining of the esophagus. What are the causes? This condition is caused by a problem with the muscle between the esophagus and the stomach. When this muscle is weak or not normal, it does not close properly to keep food and acid from coming back up from the stomach. The muscle can be weak because of:  Tobacco use.  Pregnancy.  Having a certain type of hernia (hiatal hernia).  Alcohol use.  Certain foods and drinks, such as coffee, chocolate, onions, and peppermint. What increases the risk? You are more likely to develop this condition if you:  Are overweight.  Have a disease that affects your connective tissue.  Use NSAID medicines. What are the signs or symptoms? Symptoms of this condition include:  Heartburn.  Difficult or painful swallowing.  The feeling of having a lump in the throat.  A bitter taste in the mouth.  Bad breath.  Having a lot of saliva.  Having an upset or bloated stomach.  Belching.  Chest pain. Different conditions can cause chest pain. Make sure you see your doctor if you have chest pain.  Shortness of breath or noisy breathing (wheezing).  Ongoing (chronic) cough or a cough at night.  Wearing away of the surface of teeth (tooth enamel).  Weight loss. How is this treated? Treatment will depend on how bad your symptoms are. Your  doctor may suggest:  Changes to your diet.  Medicine.  Surgery. Follow these instructions at home: Eating and drinking   Follow a diet as told by your doctor. You may  need to avoid foods and drinks such as: ? Coffee and tea (with or without caffeine). ? Drinks that contain alcohol. ? Energy drinks and sports drinks. ? Bubbly (carbonated) drinks or sodas. ? Chocolate and cocoa. ? Peppermint and mint flavorings. ? Garlic and onions. ? Horseradish. ? Spicy and acidic foods. These include peppers, chili powder, curry powder, vinegar, hot sauces, and BBQ sauce. ? Citrus fruit juices and citrus fruits, such as oranges, lemons, and limes. ? Tomato-based foods. These include red sauce, chili, salsa, and pizza with red sauce. ? Fried and fatty foods. These include donuts, french fries, potato chips, and high-fat dressings. ? High-fat meats. These include hot dogs, rib eye steak, sausage, ham, and bacon. ? High-fat dairy items, such as whole milk, butter, and cream cheese.  Eat small meals often. Avoid eating large meals.  Avoid drinking large amounts of liquid with your meals.  Avoid eating meals during the 2-3 hours before bedtime.  Avoid lying down right after you eat.  Do not exercise right after you eat. Lifestyle   Do not use any products that contain nicotine or tobacco. These include cigarettes, e-cigarettes, and chewing tobacco. If you need help quitting, ask your doctor.  Try to lower your stress. If you need help doing this, ask your doctor.  If you are overweight, lose an amount of weight that is healthy for you. Ask your doctor about a safe weight loss goal. General instructions  Pay attention to any changes in your symptoms.  Take over-the-counter and prescription medicines only as told by your doctor. Do not take aspirin, ibuprofen, or other NSAIDs unless your doctor says it is okay.  Wear loose clothes. Do not wear anything tight around your waist.  Raise  (elevate) the head of your bed about 6 inches (15 cm).  Avoid bending over if this makes your symptoms worse.  Keep all follow-up visits as told by your doctor. This is important. Contact a doctor if:  You have new symptoms.  You lose weight and you do not know why.  You have trouble swallowing or it hurts to swallow.  You have wheezing or a cough that keeps happening.  Your symptoms do not get better with treatment.  You have a hoarse voice. Get help right away if:  You have pain in your arms, neck, jaw, teeth, or back.  You feel sweaty, dizzy, or light-headed.  You have chest pain or shortness of breath.  You throw up (vomit) and your throw-up looks like blood or coffee grounds.  You pass out (faint).  Your poop (stool) is bloody or black.  You cannot swallow, drink, or eat. Summary  If a person has gastroesophageal reflux disease (GERD), food and stomach acid move back up into the esophagus and cause symptoms or problems such as damage to the esophagus.  Treatment will depend on how bad your symptoms are.  Follow a diet as told by your doctor.  Take all medicines only as told by your doctor. This information is not intended to replace advice given to you by your health care provider. Make sure you discuss any questions you have with your health care provider. Document Revised: 02/19/2018 Document Reviewed: 02/19/2018 Elsevier Patient Education  2020 [employer]

## 2020-06-02 NOTE — Progress Notes (Signed)
Sabrina Barrera is a 49 y.o. female who presents to  [facility] at [facility]  today, 06/02/20, seeking care for the following:  . Recent tx for diverticulitis, needs note to go back to work . ADHD stable on current Rx      ASSESSMENT & PLAN with other pertinent findings:  The primary encounter diagnosis was Diverticulitis. A diagnosis of Attention deficit disorder (ADD) in adult was also pertinent to this visit.   No results found for this or any previous visit (from the past 24 hour(s)).  Meds ordered this encounter  Medications  . omeprazole (PRILOSEC) 40 MG capsule    Sig: Take 1 capsule (40 mg total) by mouth daily.    Dispense:  60 capsule    Refill:  0  . amphetamine-dextroamphetamine (ADDERALL) 30 MG tablet    Sig: Take 1 tablet by mouth 2 (two) times daily.    Dispense:  60 tablet    Refill:  0    Please run w/ goodrx coupon, thanks!  [name] amphetamine-dextroamphetamine (ADDERALL) 30 MG tablet    Sig: Take 1 tablet by mouth 2 (two) times daily.    Dispense:  60 tablet    Refill:  0  . amphetamine-dextroamphetamine (ADDERALL) 30 MG tablet    Sig: Take 1 tablet by mouth 2 (two) times daily.    Dispense:  60 tablet    Refill:  0  . amphetamine-dextroamphetamine (ADDERALL) 30 MG tablet    Sig: Take 1 tablet by mouth 2 (two) times daily.    Dispense:  60 tablet    Refill:  0  . amphetamine-dextroamphetamine (ADDERALL) 30 MG tablet    Sig: Take 1 tablet by mouth 2 (two) times daily.    Dispense:  60 tablet    Refill:  0  . amphetamine-dextroamphetamine (ADDERALL) 30 MG tablet    Sig: Take 1 tablet by mouth 2 (two) times daily.    Dispense:  60 tablet    Refill:  0        Follow-up instructions: Return in about 6 months (around 12/01/2020) for MONITOR ON ADHD RX / REFILL MEDICATIONS. SEE [place] SOONER IF NEEDED .                                         BP 131/86 (BP Location: Left Arm,  Patient Position: Sitting)   Pulse 84   Wt 131 lb (59.4 kg)   SpO2 100%   BMI 20.52 kg/m   Current Meds  Medication Sig  . amoxicillin-clavulanate (AUGMENTIN) 875-125 MG tablet Take 1 tablet by mouth 2 (two) times daily.  [name] dicyclomine (BENTYL) 10 MG capsule Take 1 capsule (10 mg total) by mouth 3 (three) times daily before meals.  [name] escitalopram (LEXAPRO) 20 MG tablet Take 0.5 tablets (10 mg total) by mouth at bedtime. Please run with goodrx coupon thanks!    No results found for this or any previous visit (from the past 72 hour(s)).  No results found.     All questions at time of visit were answered - patient instructed to contact office with any additional concerns or updates.  ER/RTC precautions were reviewed with the patient as applicable.   Please note: voice recognition software was used to produce this document, and typos may escape review. Please contact Dr. Lyn Hollingshead for any needed clarifications.   Total encounter time: 30  minutes.

## 2020-07-26 ENCOUNTER — Telehealth (INDEPENDENT_AMBULATORY_CARE_PROVIDER_SITE_OTHER): Payer: Medicaid Other | Admitting: Osteopathic Medicine

## 2020-07-26 ENCOUNTER — Encounter: Payer: Self-pay | Admitting: Osteopathic Medicine

## 2020-07-26 VITALS — Wt 158.0 lb

## 2020-07-26 DIAGNOSIS — Z20822 Contact with and (suspected) exposure to covid-19: Secondary | ICD-10-CM

## 2020-07-26 DIAGNOSIS — B9789 Other viral agents as the cause of diseases classified elsewhere: Secondary | ICD-10-CM

## 2020-07-26 DIAGNOSIS — J988 Other specified respiratory disorders: Secondary | ICD-10-CM

## 2020-07-26 MED ORDER — DOXYCYCLINE HYCLATE 100 MG PO TABS
100.0000 mg | ORAL_TABLET | Freq: Two times a day (BID) | ORAL | 0 refills | Status: DC
Start: 2020-07-26 — End: 2020-12-05

## 2020-07-26 MED ORDER — FLUCONAZOLE 150 MG PO TABS: 150.0000 mg | ORAL_TABLET | Freq: Once | ORAL | 1 refills | Status: AC

## 2020-07-26 NOTE — Progress Notes (Signed)
Telemedicine Visit via  Video & Audio (App used: MyChart)   I connected with [name] on 07/26/20 at 2:26 PM  by phone or  telemedicine application as noted above  I verified that I am speaking with or regarding  the correct patient using two identifiers.  Participants: Myself, Dr Sunnie Nielsen DO Patient: Sabrina Barrera   Patient is in separate location from myself  I am in office at [facility]    I discussed the limitations of evaluation and management  by telemedicine and the availability of in person appointments.  The participant(s) above expressed understanding and  agreed to proceed with this appointment via telemedicine.       History of Present Illness: Sabrina Barrera is a 49 y.o. female who would like to discuss feeling sick    . Location: sinuses . Quality: pressure, drainage . Duration: 5 days or so . Assoc signs/symptoms: fatigue, bad nasal odor, cough . Not COVID vaccinated      Observations/Objective: Wt 158 lb (71.7 kg)   BMI 24.75 kg/m  BP Readings from Last 3 Encounters:  06/02/20 131/86  05/27/20 112/77  12/11/18 125/70   Exam: Normal Speech.  NAD  Lab and Radiology Results No results found for this or any previous visit (from the past 72 hour(s)). No results found.     Assessment and Plan: 49 y.o. female with The primary encounter diagnosis was Viral respiratory illness. A diagnosis of Suspected COVID-19 virus infection was also pertinent to this visit.   Patient requesting work note to return to work. I advised I cannot in good conscience clear her to work without at least a negative COVID test, she needs to get tested and probably needs to wait 10 days from onset of symptoms to return to work. Can trial abx for sinusitis.    PDMP not reviewed this encounter. No orders of the defined types were placed in this encounter.  Meds ordered this encounter  Medications  . doxycycline (VIBRA-TABS) 100 MG tablet     Sig: Take 1 tablet (100 mg total) by mouth 2 (two) times daily.    Dispense:  14 tablet    Refill:  0  . fluconazole (DIFLUCAN) 150 MG tablet    Sig: Take 1 tablet (150 mg total) by mouth once for 1 dose. Repeat dose 72 hours if yeast infection persists    Dispense:  2 tablet    Refill:  1      Follow Up Instructions: Return for RECHECK PENDING COVID RESULTS / IF WORSE OR CHANGE.    I discussed the assessment and treatment plan with the patient. The patient was provided an opportunity to ask questions and all were answered. The patient agreed with the plan and demonstrated an understanding of the instructions.   The patient was advised to call back or seek an in-person evaluation if any new concerns, if symptoms worsen or if the condition fails to improve as anticipated.  20 minutes of non-face-to-face time was provided during this encounter.      . . . . . . . . . . . . . [name]                   Historical information moved to improve visibility of documentation.  Past Medical History:  Diagnosis Date  . ADD (attention deficit disorder)   . Bacterial vaginosis   . Panic attack    Past Surgical History:  Procedure Laterality Date  .  CESAREAN SECTION     Social History   Tobacco Use  . Smoking status: Current Every Day Smoker    Packs/day: 1.00    Types: Cigarettes  . Smokeless tobacco: Never Used  Substance Use Topics  . Alcohol use: Yes    Alcohol/week: 1.0 standard drink    Types: 1 Standard drinks or equivalent per week    Comment: once a month   family history includes Alcohol abuse in her father; Cancer in her mother; Hypertension in her mother.  Medications: Current Outpatient Medications  Medication Sig Dispense Refill  . [START ON 08/01/2020] amphetamine-dextroamphetamine (ADDERALL) 30 MG tablet Take 1 tablet by mouth 2 (two) times daily. 60 tablet 0  . amphetamine-dextroamphetamine (ADDERALL) 30 MG tablet Take 1 tablet by  mouth 2 (two) times daily. 60 tablet 0  . [START ON 08/31/2020] amphetamine-dextroamphetamine (ADDERALL) 30 MG tablet Take 1 tablet by mouth 2 (two) times daily. 60 tablet 0  . [START ON 09/30/2020] amphetamine-dextroamphetamine (ADDERALL) 30 MG tablet Take 1 tablet by mouth 2 (two) times daily. 60 tablet 0  . [START ON 10/30/2020] amphetamine-dextroamphetamine (ADDERALL) 30 MG tablet Take 1 tablet by mouth 2 (two) times daily. 60 tablet 0  . dicyclomine (BENTYL) 10 MG capsule Take 1 capsule (10 mg total) by mouth 3 (three) times daily before meals. 45 capsule 3  . escitalopram (LEXAPRO) 20 MG tablet Take 0.5 tablets (10 mg total) by mouth at bedtime. Please run with goodrx coupon thanks! 90 tablet 3  . omeprazole (PRILOSEC) 40 MG capsule Take 1 capsule (40 mg total) by mouth daily. 60 capsule 0  . amphetamine-dextroamphetamine (ADDERALL) 30 MG tablet Take 1 tablet by mouth 2 (two) times daily. 60 tablet 0  . doxycycline (VIBRA-TABS) 100 MG tablet Take 1 tablet (100 mg total) by mouth 2 (two) times daily. 14 tablet 0   No current facility-administered medications for this visit.   Allergies  Allergen Reactions  . Azithromycin Other (See Comments)    Patient reports having hallucinations and being seen in the ED.  [name] Penicillins Other (See Comments) and Rash    Patient does not recall reactions as she has not had PCN since she was a child.  . Iron Nausea And Vomiting  . Zinc Other (See Comments)    Hallucinations  . Wellbutrin [Bupropion] Other (See Comments)    headache

## 2020-07-28 ENCOUNTER — Encounter: Payer: Self-pay | Admitting: Osteopathic Medicine

## 2020-07-29 ENCOUNTER — Telehealth: Payer: Self-pay

## 2020-07-29 NOTE — Telephone Encounter (Signed)
Ok for note 

## 2020-07-29 NOTE — Telephone Encounter (Signed)
Patient advised and note faxed.

## 2020-07-29 NOTE — Telephone Encounter (Signed)
Sabrina Barrera called and states she is feeling much better. She has taken 2 COVID tests and they are both negative. She states she has to go back to work. She needs a return to work note. Out from November 29 th and return on December 4 th. She states it has been 8 days since onset of symptoms. She has been fever free for a couple of days.   Fax to [phone]

## 2020-12-01 ENCOUNTER — Ambulatory Visit: Payer: Medicaid Other | Admitting: Osteopathic Medicine

## 2020-12-05 ENCOUNTER — Telehealth (INDEPENDENT_AMBULATORY_CARE_PROVIDER_SITE_OTHER): Payer: Medicaid Other | Admitting: Osteopathic Medicine

## 2020-12-05 DIAGNOSIS — F419 Anxiety disorder, unspecified: Secondary | ICD-10-CM

## 2020-12-05 DIAGNOSIS — F988 Other specified behavioral and emotional disorders with onset usually occurring in childhood and adolescence: Secondary | ICD-10-CM

## 2020-12-05 MED ORDER — AMPHETAMINE-DEXTROAMPHETAMINE 30 MG PO TABS: 30.0000 mg | ORAL_TABLET | Freq: Two times a day (BID) | ORAL | 0 refills | Status: DC

## 2020-12-05 MED ORDER — ESCITALOPRAM OXALATE 20 MG PO TABS: 10.0000 mg | ORAL_TABLET | Freq: Every day | ORAL | 3 refills | Status: AC

## 2020-12-05 NOTE — Progress Notes (Signed)
LVM advising pt that I was calling to do her prescreening prior to her my chart visit   

## 2020-12-05 NOTE — Progress Notes (Signed)
Telemedicine Visit via  Video & Audio (App used: MyChart)  I connected with Sabrina Barrera on 12/05/20 at 10:32 AM  by phone or  telemedicine application as noted above  I verified that I am speaking with or regarding  the correct patient using two identifiers.  Participants: Myself, Dr Sunnie Nielsen DO Patient: Sabrina Barrera Patient proxy if applicable: none Other, if applicable: none  Patient is at home I am in office at [facility]    I discussed the limitations of evaluation and management  by telemedicine and the availability of in person appointments.  The participant(s) above expressed understanding and  agreed to proceed with this appointment via telemedicine.       History of Present Illness: Sabrina Barrera is a 50 y.o. female who would like to discuss maintenance of ADHD Rx. Doing well on Adderall. Styaing focused at work, maintaining employment and sobriety!       Observations/Objective: There were no vitals taken for this visit. BP Readings from Last 3 Encounters:  06/02/20 131/86  05/27/20 112/77  12/11/18 125/70   Exam: Normal Speech.    Lab and Radiology Results No results found for this or any previous visit (from the past 72 hour(s)). No results found.     Assessment and Plan: 50 y.o. female with The primary encounter diagnosis was Attention deficit disorder (ADD) in adult. A diagnosis of Anxiety was also pertinent to this visit.  --> continue current Rx --> annual next visit w/ fasting labs due   PDMP not reviewed this encounter. No orders of the defined types were placed in this encounter.  Meds ordered this encounter  Medications  . amphetamine-dextroamphetamine (ADDERALL) 30 MG tablet    Sig: Take 1 tablet by mouth 2 (two) times daily.    Dispense:  60 tablet    Refill:  0    Please run w/ goodrx coupon, thanks!  [name] amphetamine-dextroamphetamine (ADDERALL) 30 MG tablet    Sig: Take 1 tablet by mouth 2 (two) times  daily.    Dispense:  60 tablet    Refill:  0  . amphetamine-dextroamphetamine (ADDERALL) 30 MG tablet    Sig: Take 1 tablet by mouth 2 (two) times daily.    Dispense:  60 tablet    Refill:  0  . amphetamine-dextroamphetamine (ADDERALL) 30 MG tablet    Sig: Take 1 tablet by mouth 2 (two) times daily.    Dispense:  60 tablet    Refill:  0  . amphetamine-dextroamphetamine (ADDERALL) 30 MG tablet    Sig: Take 1 tablet by mouth 2 (two) times daily.    Dispense:  60 tablet    Refill:  0  . amphetamine-dextroamphetamine (ADDERALL) 30 MG tablet    Sig: Take 1 tablet by mouth 2 (two) times daily.    Dispense:  60 tablet    Refill:  0  . escitalopram (LEXAPRO) 20 MG tablet    Sig: Take 0.5 tablets (10 mg total) by mouth at bedtime. Please run with goodrx coupon thanks!    Dispense:  90 tablet    Refill:  3   There are no Patient Instructions on file for this visit.  Instructions sent via MyChart.   Follow Up Instructions: No follow-ups on file.    I discussed the assessment and treatment plan with the patient. The patient was provided an opportunity to ask questions and all were answered. The patient agreed with the plan and demonstrated an understanding of the instructions.  The patient was advised to call back or seek an in-person evaluation if any new concerns, if symptoms worsen or if the condition fails to improve as anticipated.  15 minutes of non-face-to-face time was provided during this encounter.      . . . . . . . . . . . . . [name]                   Historical information moved to improve visibility of documentation.  Past Medical History:  Diagnosis Date  . ADD (attention deficit disorder)   . Bacterial vaginosis   . Panic attack    Past Surgical History:  Procedure Laterality Date  . CESAREAN SECTION     Social History   Tobacco Use  . Smoking status: Current Every Day Smoker    Packs/day: 1.00    Types: Cigarettes  . Smokeless  tobacco: Never Used  Substance Use Topics  . Alcohol use: Yes    Alcohol/week: 1.0 standard drink    Types: 1 Standard drinks or equivalent per week    Comment: once a month   family history includes Alcohol abuse in her father; Cancer in her mother; Hypertension in her mother.  Medications: Current Outpatient Medications  Medication Sig Dispense Refill  . [START ON 05/04/2021] amphetamine-dextroamphetamine (ADDERALL) 30 MG tablet Take 1 tablet by mouth 2 (two) times daily. 60 tablet 0  . [START ON 04/04/2021] amphetamine-dextroamphetamine (ADDERALL) 30 MG tablet Take 1 tablet by mouth 2 (two) times daily. 60 tablet 0  . [START ON 03/05/2021] amphetamine-dextroamphetamine (ADDERALL) 30 MG tablet Take 1 tablet by mouth 2 (two) times daily. 60 tablet 0  . [START ON 02/03/2021] amphetamine-dextroamphetamine (ADDERALL) 30 MG tablet Take 1 tablet by mouth 2 (two) times daily. 60 tablet 0  . [START ON 01/04/2021] amphetamine-dextroamphetamine (ADDERALL) 30 MG tablet Take 1 tablet by mouth 2 (two) times daily. 60 tablet 0  . amphetamine-dextroamphetamine (ADDERALL) 30 MG tablet Take 1 tablet by mouth 2 (two) times daily. 60 tablet 0  . dicyclomine (BENTYL) 10 MG capsule Take 1 capsule (10 mg total) by mouth 3 (three) times daily before meals. 45 capsule 3  . escitalopram (LEXAPRO) 20 MG tablet Take 0.5 tablets (10 mg total) by mouth at bedtime. Please run with goodrx coupon thanks! 90 tablet 3   No current facility-administered medications for this visit.   Allergies  Allergen Reactions  . Azithromycin Other (See Comments)    Patient reports having hallucinations and being seen in the ED.  [name] Penicillins Other (See Comments) and Rash    Patient does not recall reactions as she has not had PCN since she was a child.  . Iron Nausea And Vomiting  . Zinc Other (See Comments)    Hallucinations  . Wellbutrin [Bupropion] Other (See Comments)    headache     If phone visit, billing and coding can  please add appropriate modifier if needed

## 2021-03-20 ENCOUNTER — Emergency Department
Admission: EM | Admit: 2021-03-20 | Discharge: 2021-03-20 | Disposition: A | Discharge status: Home / Self Care | Payer: Self-pay | Source: Home / Self Care

## 2021-03-20 ENCOUNTER — Telehealth: Payer: Self-pay

## 2021-03-20 DIAGNOSIS — R000 Tachycardia, unspecified: Secondary | ICD-10-CM

## 2021-03-20 DIAGNOSIS — W57XXXA Bitten or stung by nonvenomous insect and other nonvenomous arthropods, initial encounter: Secondary | ICD-10-CM

## 2021-03-20 DIAGNOSIS — S80862A Insect bite (nonvenomous), left lower leg, initial encounter: Secondary | ICD-10-CM

## 2021-03-20 MED ORDER — DOXYCYCLINE HYCLATE 100 MG PO CAPS: 100.0000 mg | ORAL_CAPSULE | Freq: Two times a day (BID) | ORAL | 0 refills | Status: AC

## 2021-03-20 NOTE — ED Provider Notes (Signed)
[facility]    CSN: [id number] Arrival date & time: 03/20/21  1813      History   Chief Complaint Chief Complaint  Patient presents with   Insect Bite   Tachycardia   Headache    HPI Sabrina Barrera is a 50 y.o. female.   HPI 50 year old female presents with insect bite and rapid heartbeat.  Patient reports insect bite of left lower leg and bruising that occurred on Thursday of last week.  RN reports heart rate of 182 while waiting in lobby.  Patient denies anginal equivalents, shortness of breath, dizziness, lightheadedness, presyncopal or syncopal episodes.  Past Medical History:  Diagnosis Date   ADD (attention deficit disorder)    Bacterial vaginosis    Panic attack     Patient Active Problem List   Diagnosis Date Noted   Diverticulitis 05/27/2020   ADD (attention deficit disorder) 07/02/2016   Anxiety 07/02/2016   History of drug overdose 07/02/2016   History of alcoholism (HCC) 09/28/2015    Past Surgical History:  Procedure Laterality Date   CESAREAN SECTION      OB History   No obstetric history on file.      Home Medications    Prior to Admission medications   Medication Sig Start Date End Date Taking? Authorizing Provider  doxycycline (VIBRAMYCIN) 100 MG capsule Take 1 capsule (100 mg total) by mouth 2 (two) times daily for 7 days. 03/20/21 03/27/21 Yes Trevor Iha, FNP  Pseudoeph-Doxylamine-DM-APAP (NYQUIL PO) Take by mouth.   Yes [provider]  Pseudoephedrine-APAP-DM (DAYQUIL PO) Take by mouth.   Yes [provider]  amphetamine-dextroamphetamine (ADDERALL) 30 MG tablet Take 1 tablet by mouth 2 (two) times daily. 05/04/21 06/03/21  Sunnie Nielsen, DO  amphetamine-dextroamphetamine (ADDERALL) 30 MG tablet Take 1 tablet by mouth 2 (two) times daily. 04/04/21 05/04/21  Sunnie Nielsen, DO  amphetamine-dextroamphetamine (ADDERALL) 30 MG tablet Take 1 tablet by mouth 2 (two) times daily. 03/05/21 04/04/21  Sunnie Nielsen, DO  amphetamine-dextroamphetamine (ADDERALL) 30 MG tablet Take 1 tablet by mouth 2 (two) times daily. 02/03/21 03/05/21  Sunnie Nielsen, DO  amphetamine-dextroamphetamine (ADDERALL) 30 MG tablet Take 1 tablet by mouth 2 (two) times daily. 01/04/21 02/03/21  Sunnie Nielsen, DO  amphetamine-dextroamphetamine (ADDERALL) 30 MG tablet Take 1 tablet by mouth 2 (two) times daily. 12/05/20 01/04/21  Sunnie Nielsen, DO  dicyclomine (BENTYL) 10 MG capsule Take 1 capsule (10 mg total) by mouth 3 (three) times daily before meals. Patient not taking: Reported on 03/20/2021 05/27/20   Early, Sung Amabile, NP  escitalopram (LEXAPRO) 20 MG tablet Take 0.5 tablets (10 mg total) by mouth at bedtime. Please run with goodrx coupon thanks! 12/05/20   Sunnie Nielsen, DO    Family History Family History  Problem Relation Age of Onset   Cancer Mother    Hypertension Mother    Alcohol abuse Father     Social History Social History   Tobacco Use   Smoking status: Every Day    Packs/day: 1.00    Types: Cigarettes   Smokeless tobacco: Never  Substance Use Topics   Alcohol use: Yes    Alcohol/week: 1.0 standard drink    Types: 1 Standard drinks or equivalent per week    Comment: once a month   Drug use: No     Allergies   Azithromycin, Penicillins, Iron, Zinc, and Wellbutrin [bupropion]   Review of Systems Review of Systems  Cardiovascular:        Increased heart  rate, RN reports HR of 182 while waiting in our lobby  Skin:  Positive for rash.  All other systems reviewed and are negative.   Physical Exam Triage Vital Signs ED Triage Vitals  Enc Vitals Group     BP 03/20/21 1830 123/85     Pulse Rate 03/20/21 1830 (!) 183     Resp 03/20/21 1830 14     Temp 03/20/21 1830 98.4 F (36.9 C)     Temp Source 03/20/21 1830 Oral     SpO2 03/20/21 1830 97 %     Weight 03/20/21 1833 158 lb 3.2 oz (71.8 kg)     Height 03/20/21 1833 5\' 8"  (1.727 m)     Head Circumference --      Peak Flow  --      Pain Score 03/20/21 1831 0     Pain Loc --      Pain Edu? --      Excl. in GC? --    No data found.  Updated Vital Signs BP 90/73   Pulse (!) 183   Temp 98.4 F (36.9 C) (Oral)   Resp 14   Ht 5\' 8"  (1.727 m)   Wt 158 lb 3.2 oz (71.8 kg)   SpO2 97%   BMI 24.05 kg/m    Physical Exam Vitals and nursing note reviewed.  Constitutional:      General: She is not in acute distress.    Appearance: Normal appearance. She is obese. She is not ill-appearing.  HENT:     Head: Normocephalic and atraumatic.     Mouth/Throat:     Mouth: Mucous membranes are moist.     Pharynx: Oropharynx is clear.  Eyes:     Extraocular Movements: Extraocular movements intact.     Conjunctiva/sclera: Conjunctivae normal.     Pupils: Pupils are equal, round, and reactive to light.  Cardiovascular:     Rate and Rhythm: Tachycardia present.     Pulses: Normal pulses.     Heart sounds: Normal heart sounds. No murmur heard. Pulmonary:     Effort: Pulmonary effort is normal.     Breath sounds: Normal breath sounds. No wheezing, rhonchi or rales.  Musculoskeletal:        General: Normal range of motion.     Cervical back: Normal range of motion and neck supple. No tenderness.  Lymphadenopathy:     Cervical: No cervical adenopathy.  Skin:    General: Skin is warm and dry.     Comments: Left lower leg (posterior inferior aspect):~ 12 cm x 9 cm rectangular shaped erythematous area, non-pruritic per patient, patient reports being bit by insect; however, does not know what bit her.  Neurological:     General: No focal deficit present.     Mental Status: She is alert and oriented to person, place, and time.  Psychiatric:        Mood and Affect: Mood normal.        Behavior: Behavior normal.     UC Treatments / Results  Labs (all labs ordered are listed, but only abnormal results are displayed) Labs Reviewed - No data to display  EKG   Radiology No results  found.  Procedures Procedures (including critical care time)  Medications Ordered in UC Medications - No data to display  Initial Impression / Assessment and Plan / UC Course  I have reviewed the triage vital signs and the nursing notes.  Pertinent labs & imaging results that were available during  my care of the patient were reviewed by me and considered in my medical decision making (see chart for details).     MDM: 1.  Tachycardia-EKG reveals sinus tachycardia, on physical exam S1-S2 present with heart rate ranging between 94 and 96 bpm, 2.  Insect bite of left lower leg initial encounter-Rx'd Bactrim, advised patient to take medication as directed with food to completion, encourage patient to increase daily water intake while taking medication.  Patient discharged home, hemodynamically stable. Final Clinical Impressions(s) / UC Diagnoses   Final diagnoses:  Insect bite of left lower leg, initial encounter  Tachycardia     Discharge Instructions      Advised patient to take medication as directed with food to completion.  Advised patient if elevated heart rate continues please follow-up with PCP for further evaluation.     ED Prescriptions     Medication Sig Dispense Auth. Provider   doxycycline (VIBRAMYCIN) 100 MG capsule Take 1 capsule (100 mg total) by mouth 2 (two) times daily for 7 days. 14 capsule Trevor Iha, FNP      PDMP not reviewed this encounter.   Trevor Iha, [facility] 03/20/21 2033

## 2021-03-20 NOTE — ED Triage Notes (Signed)
Pt seen in UC w/ c/o insect bite with bruising that occurred thursday, headache. Pt noted to have initial HR 182.

## 2021-03-20 NOTE — Telephone Encounter (Signed)
Transition Care Management Unsuccessful Follow-up Telephone Call  Date of discharge and from where:  03/19/2021 from [facility]  Attempts:  1st Attempt  Reason for unsuccessful TCM follow-up call:  Left voice message

## 2021-03-20 NOTE — Discharge Instructions (Addendum)
Advised patient to take medication as directed with food to completion.  Advised patient if elevated heart rate continues please follow-up with PCP for further evaluation.

## 2021-03-21 NOTE — Telephone Encounter (Signed)
Transition Care Management Unsuccessful Follow-up Telephone Call  Date of discharge and from where:  03/19/21 from [facility]  Attempts:  2nd Attempt  Reason for unsuccessful TCM follow-up call:  Unable to reach patient

## 2021-03-22 NOTE — Telephone Encounter (Signed)
Transition Care Management Unsuccessful Follow-up Telephone Call  Date of discharge and from where:  03/19/21 from [facility]  Attempts:  3rd Attempt  Reason for unsuccessful TCM follow-up call:  Left voice message

## 2021-03-24 ENCOUNTER — Other Ambulatory Visit: Payer: Self-pay

## 2021-03-24 ENCOUNTER — Ambulatory Visit (INDEPENDENT_AMBULATORY_CARE_PROVIDER_SITE_OTHER): Payer: Medicaid Other | Admitting: Osteopathic Medicine

## 2021-03-24 ENCOUNTER — Encounter: Payer: Self-pay | Admitting: Osteopathic Medicine

## 2021-03-24 VITALS — BP 142/88 | HR 93 | Temp 98.1°F | Wt 156.1 lb

## 2021-03-24 DIAGNOSIS — J019 Acute sinusitis, unspecified: Secondary | ICD-10-CM

## 2021-03-24 DIAGNOSIS — Z77120 Contact with and (suspected) exposure to mold (toxic): Secondary | ICD-10-CM

## 2021-03-24 MED ORDER — PREDNISONE 10 MG PO TABS: ORAL_TABLET | ORAL | 0 refills | Status: AC

## 2021-03-24 MED ORDER — MONTELUKAST SODIUM 10 MG PO TABS: 10.0000 mg | ORAL_TABLET | Freq: Every day | ORAL | 1 refills | Status: DC

## 2021-03-24 MED ORDER — CEFDINIR 300 MG PO CAPS: 300.0000 mg | ORAL_CAPSULE | Freq: Two times a day (BID) | ORAL | 0 refills | Status: AC

## 2021-03-24 MED ORDER — LORATADINE 10 MG PO TABS: 10.0000 mg | ORAL_TABLET | Freq: Every day | ORAL | 1 refills | Status: DC

## 2021-03-24 NOTE — Progress Notes (Signed)
Sabrina Barrera is a 50 y.o. female who presents to  [facility] Primary Care & Sports Medicine at [facility]  today, 03/24/21, seeking care for the following:  Follow up from urgent care visit 03/20/21 (4 days ago)- c/o insect bite week prior to visit, and rapid HR up to 182 in UC waiting room but <100 on EKG and on exam. Pt treated w/ Doxy and d/c home stable.  Today reports Doxy caused GI upset so she stopped this. Skin on back of calf in initial area of concern is looking ok though now.  Significant issues over past few months, sinus pressure, GI upset, skin itching. Showed photos of serious mold and water damage in home particularly in her bedroom - she is living with her mom at the moment and will not be able to move out until at least 46 weeks from now. Her daughter is sleeping on the couch, she has no alternative living arrangements available and her mom does not have funds to correct the hold/water issues now.      ASSESSMENT & PLAN with other pertinent findings:  The primary encounter diagnosis was Contact with or exposure to mold. A diagnosis of Acute non-recurrent sinusitis, unspecified location was also pertinent to this visit.     Patient Instructions  Plan: Treat for sinus infection w/ Cefdinir Treat allergy to mold w/ 12-day steroid taper and also with Singulair plus Claritin untl you're able to get the mold taken care of or move out  - start all today!  Treat headache w/ Tylenol up to 1000 mg three times per day PLUS Ibuprofen up to 800 mg three times per day  OK to take all meds above together with your usual prescriptions See info for mold interventions: [email or web address] YOU NEED TO FIX THE MOLD OR MOVE or you will not get better!   No orders of the defined types were placed in this encounter.   Meds ordered this encounter  Medications   cefdinir (OMNICEF) 300 MG capsule    Sig: Take 1 capsule (300 mg total) by mouth 2 (two) times daily  for 5 days.    Dispense:  10 capsule    Refill:  0   predniSONE (DELTASONE) 10 MG tablet    Sig: Take 5 tablets (50 mg total) by mouth daily with breakfast for 2 days, THEN 4 tablets (40 mg total) daily with breakfast for 2 days, THEN 3 tablets (30 mg total) daily with breakfast for 2 days, THEN 2 tablets (20 mg total) daily with breakfast for 2 days, THEN 1 tablet (10 mg total) daily with breakfast for 2 days, THEN 0.5 tablets (5 mg total) daily with breakfast for 2 days.    Dispense:  31 tablet    Refill:  0   montelukast (SINGULAIR) 10 MG tablet    Sig: Take 1 tablet (10 mg total) by mouth at bedtime.    Dispense:  30 tablet    Refill:  1   loratadine (CLARITIN) 10 MG tablet    Sig: Take 1 tablet (10 mg total) by mouth daily.    Dispense:  30 tablet    Refill:  1     See below for relevant physical exam findings  See below for recent lab and imaging results reviewed  Medications, allergies, PMH, PSH, SocH, FamH reviewed below    Follow-up instructions: Return if symptoms worsen or fail to improve.  Exam:  BP (!) 142/88 (BP Location: Left Arm, Patient Position: Sitting, Cuff Size: Normal)   Pulse 93   Temp 98.1 F (36.7 C) (Oral)   Wt 156 lb 1.3 oz (70.8 kg)   SpO2 100%   BMI 23.73 kg/m  Constitutional: VS see above. General Appearance: alert, well-developed, well-nourished, NAD Neck: No masses, trachea midline.  Respiratory: Normal respiratory effort. no wheeze, no rhonchi, no rales Cardiovascular: S1/S2 normal, no murmur, no rub/gallop auscultated. RRR.  Musculoskeletal: Gait normal. Symmetric and independent movement of all extremities Abdominal: non-tender, non-distended, no appreciable organomegaly, neg Murphy's, BS WNLx4 Neurological: Normal balance/coordination. No tremor. Skin: warm, dry, intact.  Psychiatric: Normal judgment/insight. Normal mood and affect. Oriented x3.   Current Meds   Medication Sig   [START ON 05/04/2021] amphetamine-dextroamphetamine (ADDERALL) 30 MG tablet Take 1 tablet by mouth 2 (two) times daily.   [START ON 04/04/2021] amphetamine-dextroamphetamine (ADDERALL) 30 MG tablet Take 1 tablet by mouth 2 (two) times daily.   amphetamine-dextroamphetamine (ADDERALL) 30 MG tablet Take 1 tablet by mouth 2 (two) times daily.   cefdinir (OMNICEF) 300 MG capsule Take 1 capsule (300 mg total) by mouth 2 (two) times daily for 5 days.   dicyclomine (BENTYL) 10 MG capsule Take 1 capsule (10 mg total) by mouth 3 (three) times daily before meals.   escitalopram (LEXAPRO) 20 MG tablet Take 0.5 tablets (10 mg total) by mouth at bedtime. Please run with goodrx coupon thanks!   loratadine (CLARITIN) 10 MG tablet Take 1 tablet (10 mg total) by mouth daily.   montelukast (SINGULAIR) 10 MG tablet Take 1 tablet (10 mg total) by mouth at bedtime.   predniSONE (DELTASONE) 10 MG tablet Take 5 tablets (50 mg total) by mouth daily with breakfast for 2 days, THEN 4 tablets (40 mg total) daily with breakfast for 2 days, THEN 3 tablets (30 mg total) daily with breakfast for 2 days, THEN 2 tablets (20 mg total) daily with breakfast for 2 days, THEN 1 tablet (10 mg total) daily with breakfast for 2 days, THEN 0.5 tablets (5 mg total) daily with breakfast for 2 days.   Pseudoeph-Doxylamine-DM-APAP (NYQUIL PO) Take by mouth.   Pseudoephedrine-APAP-DM (DAYQUIL PO) Take by mouth.    Allergies  Allergen Reactions   Azithromycin Other (See Comments)    Patient reports having hallucinations and being seen in the ED.   Penicillins Other (See Comments) and Rash    Patient does not recall reactions as she has not had PCN since she was a child.   Doxycycline Other (See Comments)    GI Upset   Iron Nausea And Vomiting   Zinc Other (See Comments)    Hallucinations   Wellbutrin [Bupropion] Other (See Comments)    headache    Patient Active Problem List   Diagnosis Date Noted   Diverticulitis  05/27/2020   ADD (attention deficit disorder) 07/02/2016   Anxiety 07/02/2016   History of drug overdose 07/02/2016   History of alcoholism (HCC) 09/28/2015    Family History  Problem Relation Age of Onset   Cancer Mother    Hypertension Mother    Alcohol abuse Father     Social History   Tobacco Use  Smoking Status Every Day   Packs/day: 1.00   Types: Cigarettes  Smokeless Tobacco Never    Past Surgical History:  Procedure Laterality Date   CESAREAN SECTION       There is no immunization history on file for this patient.  No results found for  this or any previous visit (from the past 2160 hour(s)).  No results found.     All questions at time of visit were answered - patient instructed to contact office with any additional concerns or updates. ER/RTC precautions were reviewed with the patient as applicable.   Please note: manual typing as well as voice recognition software may have been used to produce this document - typos may escape review. Please contact Dr. Lyn Hollingshead for any needed clarifications.

## 2021-03-24 NOTE — Patient Instructions (Addendum)
Plan: Treat for sinus infection w/ Cefdinir Treat allergy to mold w/ 12-day steroid taper and also with Singulair plus Claritin untl you're able to get the mold taken care of or move out  - start all today!  Treat headache w/ Tylenol up to 1000 mg three times per day PLUS Ibuprofen up to 800 mg three times per day  OK to take all meds above together with your usual prescriptions See info for mold interventions: [email or web address] YOU NEED TO FIX THE MOLD OR MOVE or you will not get better!

## 2021-05-08 ENCOUNTER — Other Ambulatory Visit: Payer: Self-pay

## 2021-05-08 NOTE — Telephone Encounter (Signed)
Pt called requesting that RX for Adderall be sent to [facility] rather than [facility] because [facility] is out of this medication.  Tiajuana Amass, CMA

## 2021-05-09 MED ORDER — AMPHETAMINE-DEXTROAMPHETAMINE 30 MG PO TABS: 30.0000 mg | ORAL_TABLET | Freq: Two times a day (BID) | ORAL | 0 refills | Status: DC

## 2021-05-15 ENCOUNTER — Ambulatory Visit (INDEPENDENT_AMBULATORY_CARE_PROVIDER_SITE_OTHER): Payer: Self-pay | Admitting: Osteopathic Medicine

## 2021-05-15 ENCOUNTER — Other Ambulatory Visit: Payer: Self-pay

## 2021-05-15 VITALS — BP 151/98 | HR 80 | Temp 98.6°F | Wt 163.1 lb

## 2021-05-15 DIAGNOSIS — F988 Other specified behavioral and emotional disorders with onset usually occurring in childhood and adolescence: Secondary | ICD-10-CM

## 2021-05-15 DIAGNOSIS — I10 Essential (primary) hypertension: Secondary | ICD-10-CM

## 2021-05-15 DIAGNOSIS — Z77120 Contact with and (suspected) exposure to mold (toxic): Secondary | ICD-10-CM

## 2021-05-15 MED ORDER — AMPHETAMINE-DEXTROAMPHETAMINE 30 MG PO TABS: 30.0000 mg | ORAL_TABLET | Freq: Two times a day (BID) | ORAL | 0 refills | Status: AC

## 2021-05-15 MED ORDER — LORATADINE 10 MG PO TABS: 10.0000 mg | ORAL_TABLET | Freq: Every day | ORAL | 1 refills | Status: DC

## 2021-05-15 MED ORDER — AMPHETAMINE-DEXTROAMPHETAMINE 30 MG PO TABS: 30.0000 mg | ORAL_TABLET | Freq: Two times a day (BID) | ORAL | 0 refills | Status: DC

## 2021-05-15 MED ORDER — MONTELUKAST SODIUM 10 MG PO TABS: 10.0000 mg | ORAL_TABLET | Freq: Every day | ORAL | 1 refills | Status: DC

## 2021-05-15 NOTE — Progress Notes (Signed)
Sabrina Barrera is a 50 y.o. female who presents to  [facility] & [facility] Medicine at [facility]  today, 05/15/21, seeking care for the following:  ADHD follow-up BP follow-up: above goal Allergy / mold exposure: sounds pretty bad, living w/ her mom d/t financial contraints, won't be able to move for another few mos, mold in her room is so bad it's making the floor damp, she's keeping her clothes in plastic bags, her cat is starting to have respiratory issues as well      ASSESSMENT & PLAN with other pertinent findings:  The primary encounter diagnosis was Attention deficit disorder (ADD) in adult. Diagnoses of Essential hypertension and Contact with or exposure to mold were also pertinent to this visit.   1. Attention deficit disorder (ADD) in adult 6 mos meds written but advised I have serious concerns w/ her BP see below  2. Essential hypertension Advised start Rx, pt attributes high BP to mold, I advised less likely this, more likely smoking history and (+)FH HTN, pt would like to defer meds for now, I let her know if BP continues to be high would not recommend refill ADHD meds unless BP better / managed. Declined labs today.   3. Contact with or exposure to mold Needs to address this or respiratory issues will continue / worsen   Pt desires to see Sabrina Barrera, who was here at this clinic for her primary care fellowship, provided her current contact info for patient, will route note as FYI   Patient Instructions  For Sabrina Clamp NP [address], [place], [place] [address] Phone: [phone]  If BP still high in another few mos, will NEED RX and LABS    No orders of the defined types were placed in this encounter.   Meds ordered this encounter  Medications   montelukast (SINGULAIR) 10 MG tablet    Sig: Take 1 tablet (10 mg total) by mouth at bedtime.    Dispense:  90 tablet    Refill:  1   loratadine (CLARITIN) 10 MG tablet     Sig: Take 1 tablet (10 mg total) by mouth daily.    Dispense:  90 tablet    Refill:  1   amphetamine-dextroamphetamine (ADDERALL) 30 MG tablet    Sig: Take 1 tablet by mouth 2 (two) times daily.    Dispense:  60 tablet    Refill:  0    Please run w/ goodrx coupon, thanks!   amphetamine-dextroamphetamine (ADDERALL) 30 MG tablet    Sig: Take 1 tablet by mouth 2 (two) times daily.    Dispense:  60 tablet    Refill:  0   amphetamine-dextroamphetamine (ADDERALL) 30 MG tablet    Sig: Take 1 tablet by mouth 2 (two) times daily.    Dispense:  60 tablet    Refill:  0   amphetamine-dextroamphetamine (ADDERALL) 30 MG tablet    Sig: Take 1 tablet by mouth 2 (two) times daily.    Dispense:  60 tablet    Refill:  0   amphetamine-dextroamphetamine (ADDERALL) 30 MG tablet    Sig: Take 1 tablet by mouth 2 (two) times daily.    Dispense:  60 tablet    Refill:  0   amphetamine-dextroamphetamine (ADDERALL) 30 MG tablet    Sig: Take 1 tablet by mouth 2 (two) times daily.    Dispense:  60 tablet    Refill:  0     See below for  relevant physical exam findings  See below for recent lab and imaging results reviewed  Medications, allergies, PMH, PSH, SocH, FamH reviewed below    Follow-up instructions: Return in about 6 months (around 11/12/2021) for establish w/ Sabrina Barrera - see [place] sooner if needed! .                                        Exam:  BP (!) 151/98 (BP Location: Left Arm, Patient Position: Sitting, Cuff Size: Normal)   Pulse 80   Temp 98.6 F (37 C) (Oral)   Wt 163 lb 1.9 oz (74 kg)   BMI 24.80 kg/m  Constitutional: VS see above. General Appearance: alert, well-developed, well-nourished, NAD Neck: No masses, trachea midline.  Respiratory: Normal respiratory effort. no wheeze, no rhonchi, no rales Cardiovascular: S1/S2 normal, no murmur, no rub/gallop auscultated. RRR.  Musculoskeletal: Gait normal. Symmetric and independent movement  of all extremities Abdominal: non-tender, non-distended, no appreciable organomegaly, neg Murphy's, BS WNLx4 Neurological: Normal balance/coordination. No tremor. Skin: warm, dry, intact.  Psychiatric: Normal judgment/insight. Normal mood and affect. Oriented x3.   Current Meds  Medication Sig   [START ON 11/05/2021] amphetamine-dextroamphetamine (ADDERALL) 30 MG tablet Take 1 tablet by mouth 2 (two) times daily.   dicyclomine (BENTYL) 10 MG capsule Take 1 capsule (10 mg total) by mouth 3 (three) times daily before meals.   escitalopram (LEXAPRO) 20 MG tablet Take 0.5 tablets (10 mg total) by mouth at bedtime. Please run with goodrx coupon thanks!   Pseudoeph-Doxylamine-DM-APAP (NYQUIL PO) Take by mouth.   Pseudoephedrine-APAP-DM (DAYQUIL PO) Take by mouth.   [DISCONTINUED] amphetamine-dextroamphetamine (ADDERALL) 30 MG tablet Take 1 tablet by mouth 2 (two) times daily.   [DISCONTINUED] loratadine (CLARITIN) 10 MG tablet Take 1 tablet (10 mg total) by mouth daily.   [DISCONTINUED] montelukast (SINGULAIR) 10 MG tablet Take 1 tablet (10 mg total) by mouth at bedtime.    Allergies  Allergen Reactions   Azithromycin Other (See Comments)    Patient reports having hallucinations and being seen in the ED.   Penicillins Other (See Comments) and Rash    Patient does not recall reactions as she has not had PCN since she was a child.   Doxycycline Other (See Comments)    GI Upset   Iron Nausea And Vomiting   Zinc Other (See Comments)    Hallucinations   Wellbutrin [Bupropion] Other (See Comments)    headache    Patient Active Problem List   Diagnosis Date Noted   Diverticulitis 05/27/2020   ADD (attention deficit disorder) 07/02/2016   Anxiety 07/02/2016   History of drug overdose 07/02/2016   History of alcoholism (HCC) 09/28/2015    Family History  Problem Relation Age of Onset   Cancer Mother    Hypertension Mother    Alcohol abuse Father     Social History   Tobacco Use   Smoking Status Every Day   Packs/day: 1.00   Types: Cigarettes  Smokeless Tobacco Never    Past Surgical History:  Procedure Laterality Date   CESAREAN SECTION       There is no immunization history on file for this patient.  No results found for this or any previous visit (from the past 2160 hour(s)).  No results found.     All questions at time of visit were answered - patient instructed to contact office with any additional concerns or  updates. ER/RTC precautions were reviewed with the patient as applicable.   Please note: manual typing as well as voice recognition software may have been used to produce this document - typos may escape review. Please contact Dr. Sheppard Coil for any needed clarifications.

## 2021-05-15 NOTE — Patient Instructions (Signed)
For [name] NP [address], [place], [place] [address] Phone: [phone]  If BP still high in another few mos, will NEED RX and LABS

## 2021-05-16 ENCOUNTER — Other Ambulatory Visit: Payer: Self-pay

## 2021-06-02 ENCOUNTER — Telehealth: Payer: Self-pay | Admitting: Osteopathic Medicine

## 2021-06-02 NOTE — Telephone Encounter (Signed)
Please call patient

## 2021-06-02 NOTE — Telephone Encounter (Signed)
Pt called. She takes magnesium citrate when she's constipated but its on recall now.  She wants to know what can she take. Thanks

## 2021-06-05 NOTE — Telephone Encounter (Signed)
LMOM for pt to return call. 

## 2021-06-08 NOTE — Telephone Encounter (Signed)
Thank you :)

## 2021-09-25 ENCOUNTER — Emergency Department: Admit: 2021-09-25 | Discharge status: Absent without leave | Payer: Self-pay

## 2021-09-25 ENCOUNTER — Emergency Department
Admission: EM | Admit: 2021-09-25 | Discharge: 2021-09-25 | Disposition: A | Discharge status: Home / Self Care | Payer: Self-pay | Source: Home / Self Care | Attending: Family Medicine | Admitting: Family Medicine

## 2021-09-25 ENCOUNTER — Emergency Department (INDEPENDENT_AMBULATORY_CARE_PROVIDER_SITE_OTHER): Payer: Self-pay

## 2021-09-25 ENCOUNTER — Other Ambulatory Visit: Payer: Self-pay

## 2021-09-25 DIAGNOSIS — S92514A Nondisplaced fracture of proximal phalanx of right lesser toe(s), initial encounter for closed fracture: Secondary | ICD-10-CM

## 2021-09-25 DIAGNOSIS — M79671 Pain in right foot: Secondary | ICD-10-CM

## 2021-09-25 MED ORDER — TRAMADOL-ACETAMINOPHEN 37.5-325 MG PO TABS: 1.0000 | ORAL_TABLET | Freq: Four times a day (QID) | ORAL | 0 refills | Status: DC | PRN

## 2021-09-25 NOTE — Discharge Instructions (Addendum)
You must wear the fracture shoe at all times while walking You should be seen in follow-up in 2 to 4 weeks for repeat x-rays.  If you are unable to see Christ[facility] Butter for primary care, you may see one of the sports medicine specialists. Use ice and elevation to reduce pain and swelling May take Tylenol, Advil, Aleve for moderate pain Take Ultracet if needed for severe pain.  Do not drive on Ultracet

## 2021-09-25 NOTE — ED Triage Notes (Signed)
Pt c/o RT foot pain since yesterday when she injured them on the foot of a nightstand. Bruising noted. Pain 10/10 Ibuprofen and aleve prn with no relief.

## 2021-09-25 NOTE — ED Provider Notes (Signed)
[name]    CSN: [id number] Arrival date & time: 09/25/21  1311      History   Chief Complaint Chief Complaint  Patient presents with   Foot Injury    RT    HPI Sabrina Barrera is a 51 y.o. female.   HPI  Patient accidentally kicked a nightstand last night.  Her right foot hit forcibly and her fourth and fifth toes got "bent outward".  Severe pain in the ball of her foot, lateral aspect.  Swelling.  Bruising.  No deformity.  Cannot put weight on the distal foot  Past Medical History:  Diagnosis Date   ADD (attention deficit disorder)    Bacterial vaginosis    Panic attack     Patient Active Problem List   Diagnosis Date Noted   Diverticulitis 05/27/2020   ADD (attention deficit disorder) 07/02/2016   Anxiety 07/02/2016   History of drug overdose 07/02/2016   History of alcoholism (HCC) 09/28/2015    Past Surgical History:  Procedure Laterality Date   CESAREAN SECTION      OB History   No obstetric history on file.      Home Medications    Prior to Admission medications   Medication Sig Start Date End Date Taking? Authorizing Provider  traMADol-acetaminophen (ULTRACET) 37.5-325 MG tablet Take 1-2 tablets by mouth every 6 (six) hours as needed. 09/25/21  Yes Eustace MooreNelson, Reiko Vinje Sue, MD  amphetamine-dextroamphetamine (ADDERALL) 30 MG tablet Take 1 tablet by mouth 2 (two) times daily. 08/07/21 09/06/21  Sunnie NielsenAlexander, Natalie, DO  amphetamine-dextroamphetamine (ADDERALL) 30 MG tablet Take 1 tablet by mouth 2 (two) times daily. 07/08/21 08/07/21  Sunnie NielsenAlexander, Natalie, DO  amphetamine-dextroamphetamine (ADDERALL) 30 MG tablet Take 1 tablet by mouth 2 (two) times daily. 06/08/21 07/08/21  Sunnie NielsenAlexander, Natalie, DO  amphetamine-dextroamphetamine (ADDERALL) 30 MG tablet Take 1 tablet by mouth 2 (two) times daily. 11/05/21 12/05/21  Sunnie NielsenAlexander, Natalie, DO  dicyclomine (BENTYL) 10 MG capsule Take 1 capsule (10 mg total) by mouth 3 (three) times daily before meals. 05/27/20    Tollie EthEarly, Sara E, NP  escitalopram (LEXAPRO) 20 MG tablet Take 0.5 tablets (10 mg total) by mouth at bedtime. Please run with goodrx coupon thanks! 12/05/20   Sunnie NielsenAlexander, Natalie, DO  loratadine (CLARITIN) 10 MG tablet Take 1 tablet (10 mg total) by mouth daily. 05/15/21   Sunnie NielsenAlexander, Natalie, DO  montelukast (SINGULAIR) 10 MG tablet Take 1 tablet (10 mg total) by mouth at bedtime. 05/15/21   Sunnie NielsenAlexander, Natalie, DO  Pseudoeph-Doxylamine-DM-APAP (NYQUIL PO) Take by mouth.    [provider]  Pseudoephedrine-APAP-DM (DAYQUIL PO) Take by mouth.    [provider]    Family History Family History  Problem Relation Age of Onset   Cancer Mother    Hypertension Mother    Alcohol abuse Father     Social History Social History   Tobacco Use   Smoking status: Every Day    Packs/day: 1.00    Types: Cigarettes   Smokeless tobacco: Never  Substance Use Topics   Alcohol use: Yes    Alcohol/week: 1.0 standard drink    Types: 1 Standard drinks or equivalent per week    Comment: once a month   Drug use: No     Allergies   Azithromycin, Penicillins, Doxycycline, Iron, Zinc, and Wellbutrin [bupropion]   Review of Systems Review of Systems See HPI  Physical Exam Triage Vital Signs ED Triage Vitals  Enc Vitals Group     BP 09/25/21 1326 123/87  Pulse Rate 09/25/21 1326 (!) 109     Resp 09/25/21 1326 18     Temp 09/25/21 1326 98.2 F (36.8 C)     Temp Source 09/25/21 1326 Oral     SpO2 09/25/21 1326 97 %     Weight --      Height --      Head Circumference --      Peak Flow --      Pain Score 09/25/21 1328 10     Pain Loc --      Pain Edu? --      Excl. in GC? --    No data found.  Updated Vital Signs BP 123/87 (BP Location: Right Arm)    Pulse (!) 109    Temp 98.2 F (36.8 C) (Oral)    Resp 18    SpO2 97%      Physical Exam Constitutional:      General: She is not in acute distress.    Appearance: She is well-developed.  HENT:     Head: Normocephalic  and atraumatic.     Mouth/Throat:     Comments: Mask is in place Eyes:     Conjunctiva/sclera: Conjunctivae normal.     Pupils: Pupils are equal, round, and reactive to light.  Cardiovascular:     Rate and Rhythm: Normal rate.  Pulmonary:     Effort: Pulmonary effort is normal. No respiratory distress.  Abdominal:     General: There is no distension.     Palpations: Abdomen is soft.  Musculoskeletal:        General: Normal range of motion.     Cervical back: Normal range of motion.     Comments: Fourth and fifth toes, MCP area acutely tender.  Pain with any elevation.  Pain with any movement of toes.  Bruising and swelling noted  Skin:    General: Skin is warm and dry.  Neurological:     Mental Status: She is alert.     Gait: Gait abnormal.  Psychiatric:        Mood and Affect: Mood normal.     UC Treatments / Results  Labs (all labs ordered are listed, but only abnormal results are displayed) Labs Reviewed - No data to display  EKG   Radiology DG Foot Complete Right  Result Date: 09/25/2021 CLINICAL DATA:  Injury. EXAM: RIGHT FOOT COMPLETE - 3+ VIEW COMPARISON:  None. FINDINGS: Minimally displaced oblique fracture of the fourth toe proximal phalanx. Fracture does not have intra-articular involvement. No other definite fractures. Prominent plantar calcaneal spur. IMPRESSION: Minimally displaced fracture involving the fourth toe proximal phalanx. Electronically Signed   By: Richarda Overlie M.D.   On: 09/25/2021 13:54    Procedures Procedures (including critical care time)  Medications Ordered in UC Medications - No data to display  Initial Impression / Assessment and Plan / UC Course  I have reviewed the triage vital signs and the nursing notes.  Pertinent labs & imaging results that were available during my care of the patient were reviewed by me and considered in my medical decision making (see chart for details).     Reviewed fracture.  Management.  Follow-up with  sports medicine Final Clinical Impressions(s) / UC Diagnoses   Final diagnoses:  Closed nondisplaced fracture of proximal phalanx of lesser toe of right foot, initial encounter     Discharge Instructions      You must wear the fracture shoe at all times while walking You should  be seen in follow-up in 2 to 4 weeks for repeat x-rays.  If you are unable to see Christen Butter for primary care, you may see one of the sports medicine specialists. Use ice and elevation to reduce pain and swelling May take Tylenol, Advil, Aleve for moderate pain Take Ultracet if needed for severe pain.  Do not drive on Ultracet     ED Prescriptions     Medication Sig Dispense Auth. Provider   traMADol-acetaminophen (ULTRACET) 37.5-325 MG tablet Take 1-2 tablets by mouth every 6 (six) hours as needed. 20 tablet Eustace Moore, MD      I have reviewed the PDMP during this encounter.   Eustace Moore, MD 09/25/21 [phone]

## 2021-11-09 ENCOUNTER — Other Ambulatory Visit: Payer: Self-pay

## 2021-11-09 ENCOUNTER — Encounter: Payer: Self-pay | Admitting: Family Medicine

## 2021-11-09 ENCOUNTER — Emergency Department (INDEPENDENT_AMBULATORY_CARE_PROVIDER_SITE_OTHER)
Admission: EM | Admit: 2021-11-09 | Discharge: 2021-11-09 | Disposition: A | Discharge status: Home / Self Care | Payer: Self-pay | Source: Home / Self Care

## 2021-11-09 DIAGNOSIS — J309 Allergic rhinitis, unspecified: Secondary | ICD-10-CM

## 2021-11-09 DIAGNOSIS — J3489 Other specified disorders of nose and nasal sinuses: Secondary | ICD-10-CM

## 2021-11-09 DIAGNOSIS — J0100 Acute maxillary sinusitis, unspecified: Secondary | ICD-10-CM

## 2021-11-09 MED ORDER — SULFAMETHOXAZOLE-TRIMETHOPRIM 800-160 MG PO TABS: 1.0000 | ORAL_TABLET | Freq: Two times a day (BID) | ORAL | 0 refills | Status: AC

## 2021-11-09 MED ORDER — FEXOFENADINE HCL 180 MG PO TABS: 180.0000 mg | ORAL_TABLET | Freq: Every day | ORAL | 0 refills | Status: AC

## 2021-11-09 MED ORDER — PREDNISONE 20 MG PO TABS: ORAL_TABLET | ORAL | 0 refills | Status: AC
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
## 2021-11-09 NOTE — Discharge Instructions (Addendum)
Advised patient to take medication as directed with food to completion.  Instructed patient to take 1 Bactrim and 1 Allegra now with dinner.  Advised tomorrow morning, Friday, 11/10/2021 advised patient to take prednisone and Allegra with first dose of Bactrim for the next 5 of 7 days.  Advised may use Allegra as needed afterwards for concurrent postnasal drainage/drip.  Encouraged patient to increase daily water intake while taking these medications.  Advised patient if symptoms worsen and/or unresolved please follow-up with PCP or here for further evaluation. ?

## 2021-11-17 ENCOUNTER — Telehealth: Payer: Self-pay

## 2021-11-17 NOTE — Telephone Encounter (Signed)
Pt called requesting RX for Adderall since [facility] is out of this medication.  She is requesting that a new RX be sent to [facility].  Advised pt that Dr. Redgie Grayer last [facility] note stated that the pt would be transferring her care to [facility].  Pt states that she has not transferred her care.  Advised pt that she would need a TOC appt before this controlled substance can be prescribed.  Pt expressed understanding.  Charyl Bigger, CMA ?

## 2022-08-18 ENCOUNTER — Encounter (HOSPITAL_BASED_OUTPATIENT_CLINIC_OR_DEPARTMENT_OTHER): Payer: Self-pay | Admitting: Emergency Medicine

## 2022-08-18 ENCOUNTER — Emergency Department (HOSPITAL_BASED_OUTPATIENT_CLINIC_OR_DEPARTMENT_OTHER)
Admission: EM | Admit: 2022-08-18 | Discharge: 2022-08-18 | Disposition: A | Discharge status: Home / Self Care | Payer: Medicaid Other | Attending: Emergency Medicine | Admitting: Emergency Medicine

## 2022-08-18 ENCOUNTER — Other Ambulatory Visit: Payer: Self-pay

## 2022-08-18 DIAGNOSIS — R220 Localized swelling, mass and lump, head: Secondary | ICD-10-CM

## 2022-08-18 DIAGNOSIS — L03211 Cellulitis of face: Secondary | ICD-10-CM | POA: Insufficient documentation

## 2022-08-18 DIAGNOSIS — K047 Periapical abscess without sinus: Secondary | ICD-10-CM | POA: Insufficient documentation

## 2022-08-18 MED ORDER — STERILE WATER FOR INJECTION IJ SOLN
INTRAMUSCULAR | Status: AC
  Administered 2022-08-18: 1 mL
  Filled 2022-08-18: qty 10

## 2022-08-18 MED ORDER — CEFTRIAXONE SODIUM 500 MG IJ SOLR
500.0000 mg | Freq: Once | INTRAMUSCULAR | Status: AC
  Administered 2022-08-18: 500 mg via INTRAMUSCULAR
  Filled 2022-08-18: qty 500

## 2022-08-18 MED ORDER — PENICILLIN V POTASSIUM 500 MG PO TABS: 500.0000 mg | ORAL_TABLET | Freq: Four times a day (QID) | ORAL | 0 refills | Status: AC

## 2022-08-18 NOTE — ED Notes (Signed)
D/c paperwork reviewed with pt, including prescription and f/u care. Pt informed of elevated BP, she reports this is a chronic problem.  No further questions or concerns voiced by pt at time of d/c.

## 2022-08-18 NOTE — Discharge Instructions (Addendum)
Take the penicillin as directed for the next 7 days.  Be observant for development of any rash or hives or lip swelling tongue swelling or difficulty breathing.  Sounds as if the childhood allergy to penicillin is questionable.  And since she gets severe diarrhea with clindamycin we do not have much choice other than to try this.  Would expect improvement over the next few days.  Return for any new or worse symptoms.  Also given a shot of Rocephin here today.  Follow-up with dentist would be important for the tooth that is probably the source they are on the left upper part of the mouth.

## 2022-08-18 NOTE — ED Triage Notes (Signed)
Patient has had swelling to left side of her face since yesterday, increased swelling today. Patient states she was recently diagnosed with diverticulitis, but it gave her sever diarrhea so she stopped it, and she was also on a steroid due to a possible sinus infection, but it was drying her mouth out. She is concerned she could have an infected tooth, but reports she has never had swelling like this with past dental issues.

## 2022-08-18 NOTE — ED Provider Notes (Signed)
[facility] EMERGENCY DEPARTMENT Provider Note   CSN: [id number] Arrival date & time: 08/18/22  1143     History  Chief Complaint  Patient presents with   Facial Swelling    Sabrina Barrera is a 51 y.o. female.  With acute onset of left facial swelling upper cheek area to the base of the eye.  Patient's had a cracked tooth they are on the left upper side.  That is painful to press on.  Patient thinks it may be the source of the infection.  Patient without any nausea or vomiting no diarrhea.  Patient has been on clindamycin recently and developed a lot of diarrhea with that.  Reportedly has a penicillin allergy from childhood but she has never really been on penicillin since that time to know whether that is legitimate.  Patient also was on prednisone recently for a sinus infection.  Patient has a history of attention deficit disorder.       Home Medications Prior to Admission medications   Medication Sig Start Date End Date Taking? Authorizing Provider  penicillin v potassium (VEETID) 500 MG tablet Take 1 tablet (500 mg total) by mouth 4 (four) times daily for 7 days. 08/18/22 08/25/22 Yes Vanetta Mulders, MD  amphetamine-dextroamphetamine (ADDERALL) 30 MG tablet Take 1 tablet by mouth 2 (two) times daily. 08/07/21 09/06/21  Sunnie Nielsen, DO  amphetamine-dextroamphetamine (ADDERALL) 30 MG tablet Take 1 tablet by mouth 2 (two) times daily. 07/08/21 08/07/21  Sunnie Nielsen, DO  amphetamine-dextroamphetamine (ADDERALL) 30 MG tablet Take 1 tablet by mouth 2 (two) times daily. 06/08/21 07/08/21  Sunnie Nielsen, DO  amphetamine-dextroamphetamine (ADDERALL) 30 MG tablet Take 1 tablet by mouth 2 (two) times daily. 11/05/21 12/05/21  Sunnie Nielsen, DO  escitalopram (LEXAPRO) 20 MG tablet Take 0.5 tablets (10 mg total) by mouth at bedtime. Please run with goodrx coupon thanks! 12/05/20   Sunnie Nielsen, DO  fexofenadine [facility] ALLERGY) 180 MG tablet Take 1  tablet (180 mg total) by mouth daily for 15 days. 11/09/21 11/24/21  Trevor Iha, FNP  predniSONE (DELTASONE) 20 MG tablet Take 3 tabs PO daily x 5 days. 11/09/21   Trevor Iha, FNP      Allergies    Azithromycin, Penicillins, Doxycycline, Iron, Zinc, and Wellbutrin [bupropion]    Review of Systems   Review of Systems  Constitutional:  Negative for chills and fever.  HENT:  Positive for dental problem and facial swelling. Negative for ear pain and sore throat.   Eyes:  Negative for pain and visual disturbance.  Respiratory:  Negative for cough and shortness of breath.   Cardiovascular:  Negative for chest pain and palpitations.  Gastrointestinal:  Negative for abdominal pain and vomiting.  Genitourinary:  Negative for dysuria and hematuria.  Musculoskeletal:  Negative for arthralgias and back pain.  Skin:  Negative for color change and rash.  Neurological:  Negative for seizures and syncope.  All other systems reviewed and are negative.   Physical Exam Updated Vital Signs BP (!) 182/103 (BP Location: Right Arm)   Pulse 90   Temp 98.1 F (36.7 C) (Oral)   Resp 18   Ht 1.727 m (5\' 8" )   Wt 74.8 kg   LMP  (LMP Unknown)   SpO2 100%   BMI 25.09 kg/m  Physical Exam Vitals and nursing note reviewed.  Constitutional:      General: She is not in acute distress.    Appearance: Normal appearance. She is well-developed.  HENT:  Head: Normocephalic.     Comments: Left facial swelling left cheek area up into the base of the eye.  No significant erythema.  Little bit of induration no increased warmth.  Slightly tender to palpation.    Mouth/Throat:     Mouth: Mucous membranes are moist.     Comments: Tenderness to palpation to the left canine upper canine area with a cracked tooth.  Also possibly premolar. Eyes:     Extraocular Movements: Extraocular movements intact.     Conjunctiva/sclera: Conjunctivae normal.     Pupils: Pupils are equal, round, and reactive to light.   Cardiovascular:     Rate and Rhythm: Normal rate and regular rhythm.     Heart sounds: No murmur heard. Pulmonary:     Effort: Pulmonary effort is normal. No respiratory distress.     Breath sounds: Normal breath sounds.  Abdominal:     Palpations: Abdomen is soft.     Tenderness: There is no abdominal tenderness.  Musculoskeletal:        General: No swelling.     Cervical back: Normal range of motion and neck supple. No rigidity.  Lymphadenopathy:     Cervical: No cervical adenopathy.  Skin:    General: Skin is warm and dry.     Capillary Refill: Capillary refill takes less than 2 seconds.  Neurological:     General: No focal deficit present.     Mental Status: She is alert and oriented to person, place, and time.     Cranial Nerves: No cranial nerve deficit.     Sensory: No sensory deficit.  Psychiatric:        Mood and Affect: Mood normal.     ED Results / Procedures / Treatments   Labs (all labs ordered are listed, but only abnormal results are displayed) Labs Reviewed - No data to display  EKG None  Radiology No results found.  Procedures Procedures    Medications Ordered in ED Medications  cefTRIAXone (ROCEPHIN) injection 500 mg (has no administration in time range)    ED Course/ Medical Decision Making/ A&P                           Medical Decision Making Risk Prescription drug management.   Left facial swelling cellulitis probably secondary to infection of the left upper tooth.  Patient does not want clindamycin because it gives her diarrhea.  So we will give a trial of penicillin every 6 hours.  She not able to pick up that prescription because she is on her way to work.  So we will give her a shot of Rocephin here as a booster.  Recommend follow-up with dentist for the tooth.  Patient will watch for any signs of allergic reaction.  Her penicillin allergy was when she was a child and is not clearly defined.   Final Clinical Impression(s) / ED  Diagnoses Final diagnoses:  Facial cellulitis  Dental abscess  Left facial swelling    Rx / DC Orders ED Discharge Orders          Ordered    penicillin v potassium (VEETID) 500 MG tablet  4 times daily        08/18/22 1334              Fredia Sorrow, MD 08/18/22 1338

## 2022-10-30 ENCOUNTER — Telehealth: Payer: Self-pay | Admitting: Family Medicine

## 2022-10-30 NOTE — Telephone Encounter (Signed)
Good Afternoon, Patient's mom called and ask if patient could transfer her care here she last saw Dr. Sheppard Coil in 2022. Please advise

## 2022-10-30 NOTE — Telephone Encounter (Signed)
I would offer Dr. Mel Almond first since she is working on building a [employer].  I am willing to see her as a TOC as well if she doesn't want to see Joy or Dr. Mel Almond.

## 2024-01-27 IMAGING — DX DG FOOT COMPLETE 3+V*R*
4 series · 5 of 5 positions shown · non-contrast
Comparison: None.

CLINICAL DATA: Injury.

EXAM:
RIGHT FOOT COMPLETE - 3+ VIEW

[foot ap (1 of 2)]
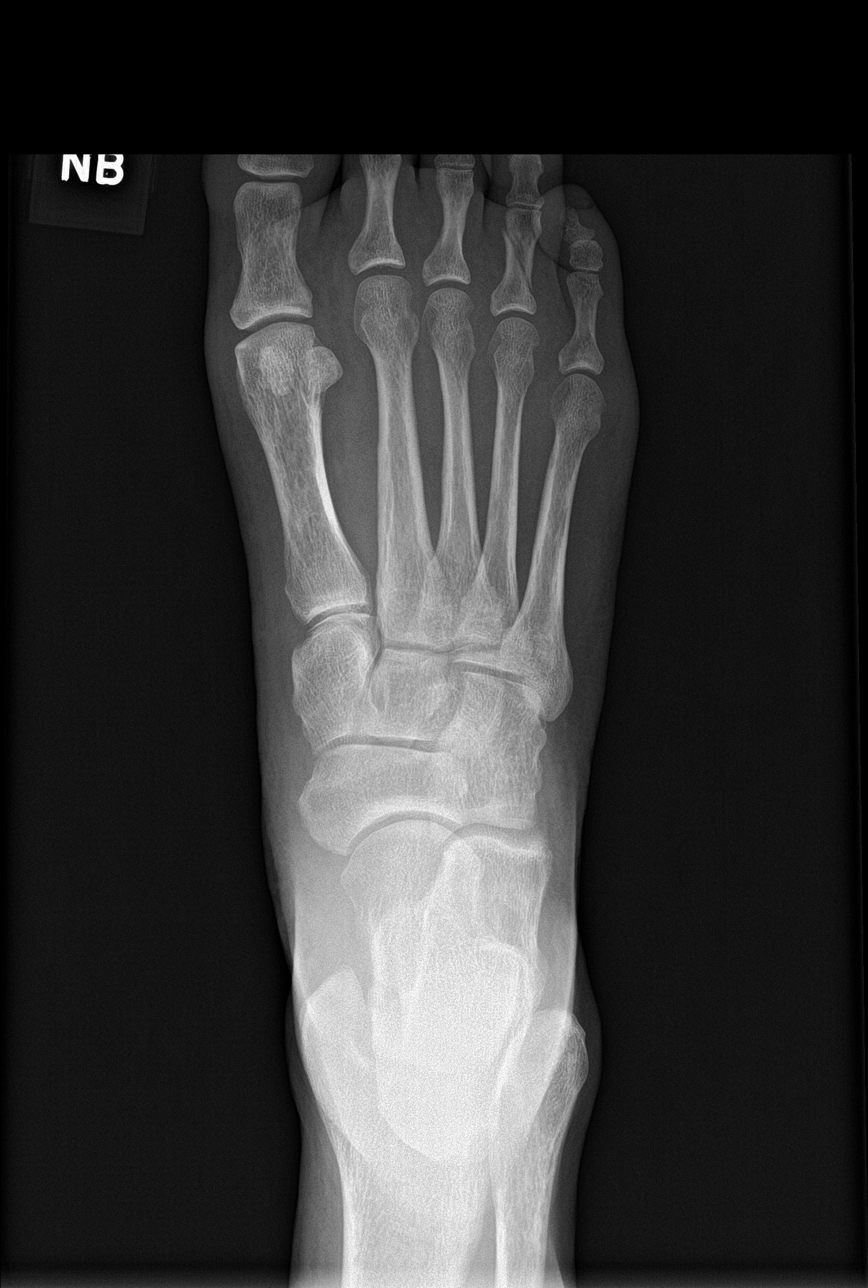

[foot obl]
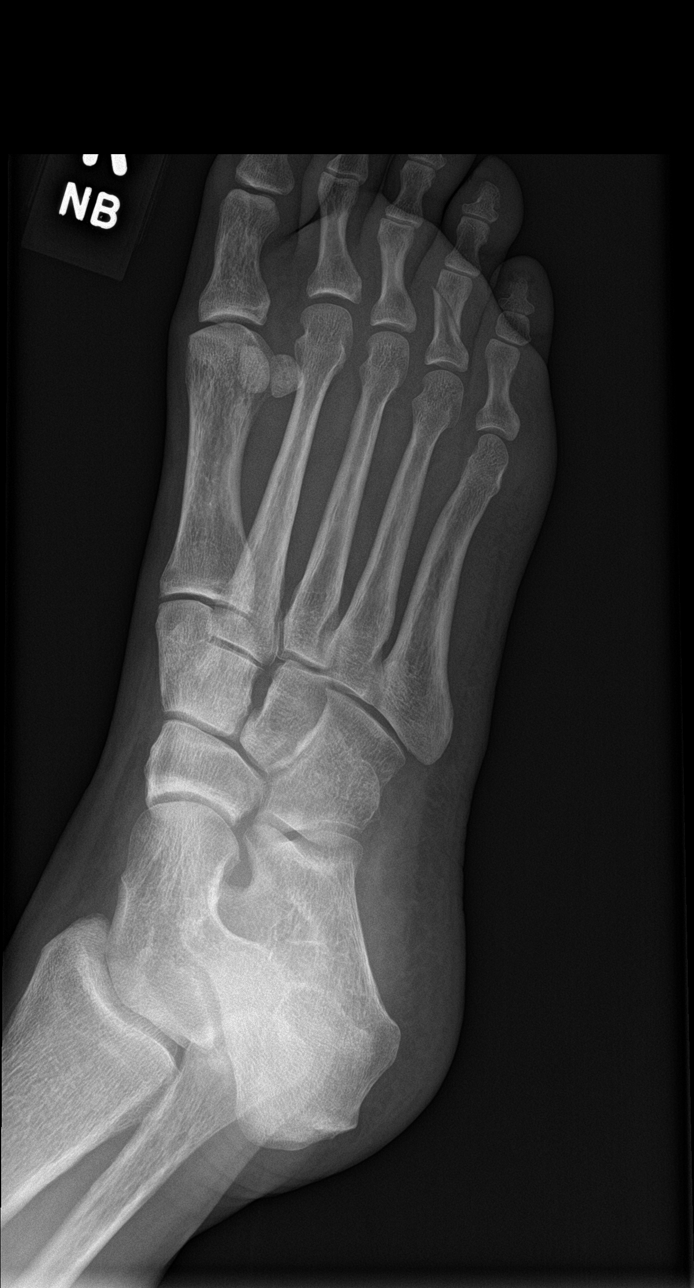

[Series 3: foot lat · 0.14mm/px · 2 of 2 slices shown]
[im 1/2]
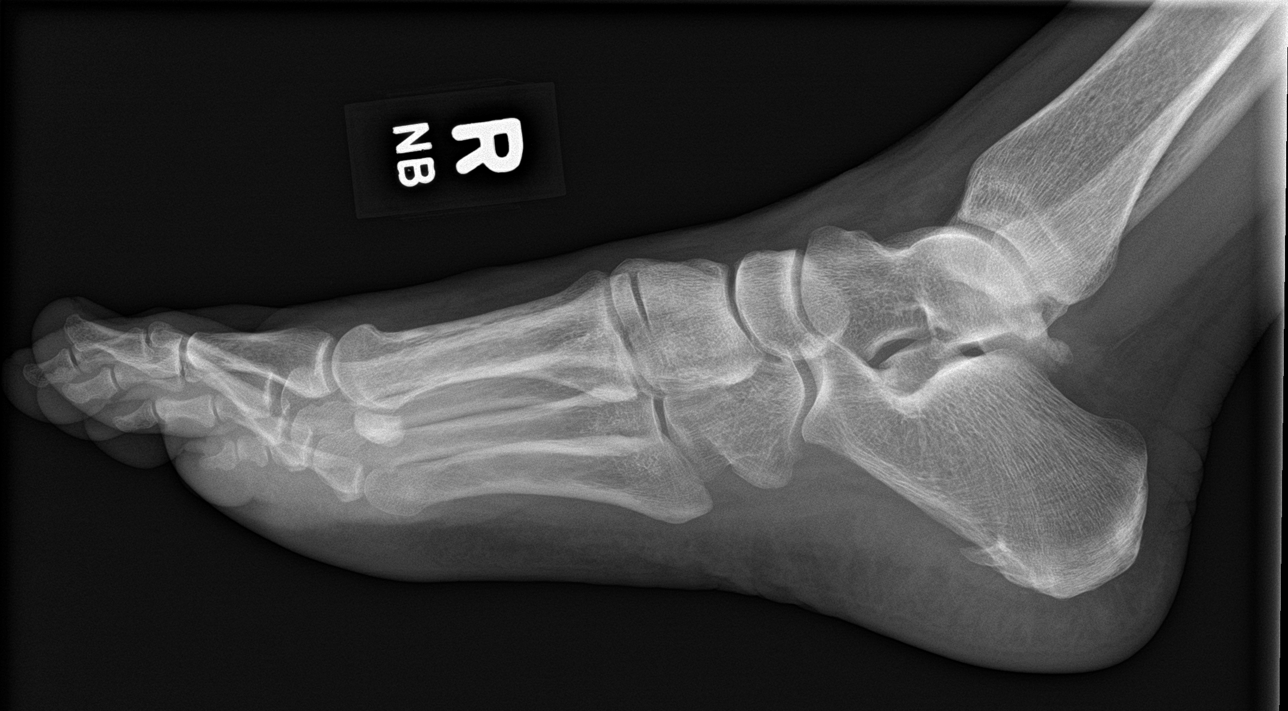
[im 2/2]
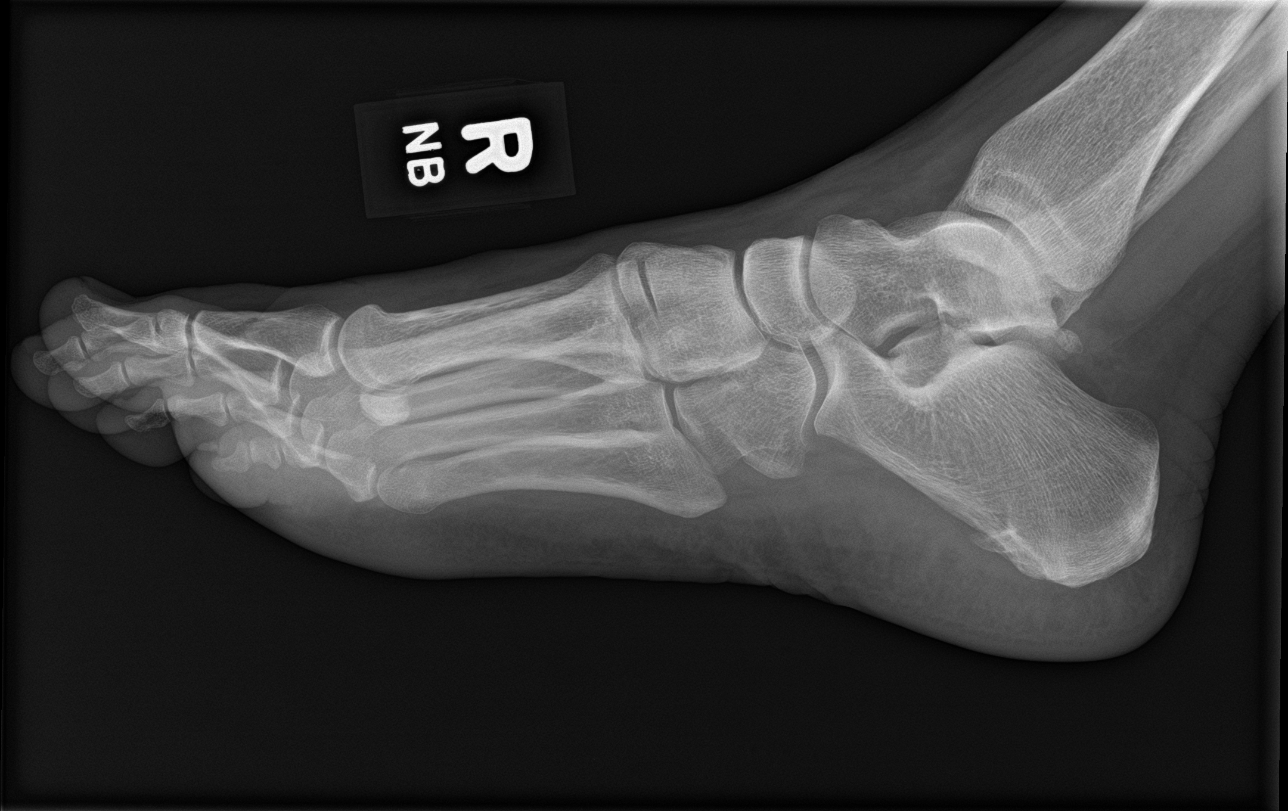

[foot ap (2 of 2)]
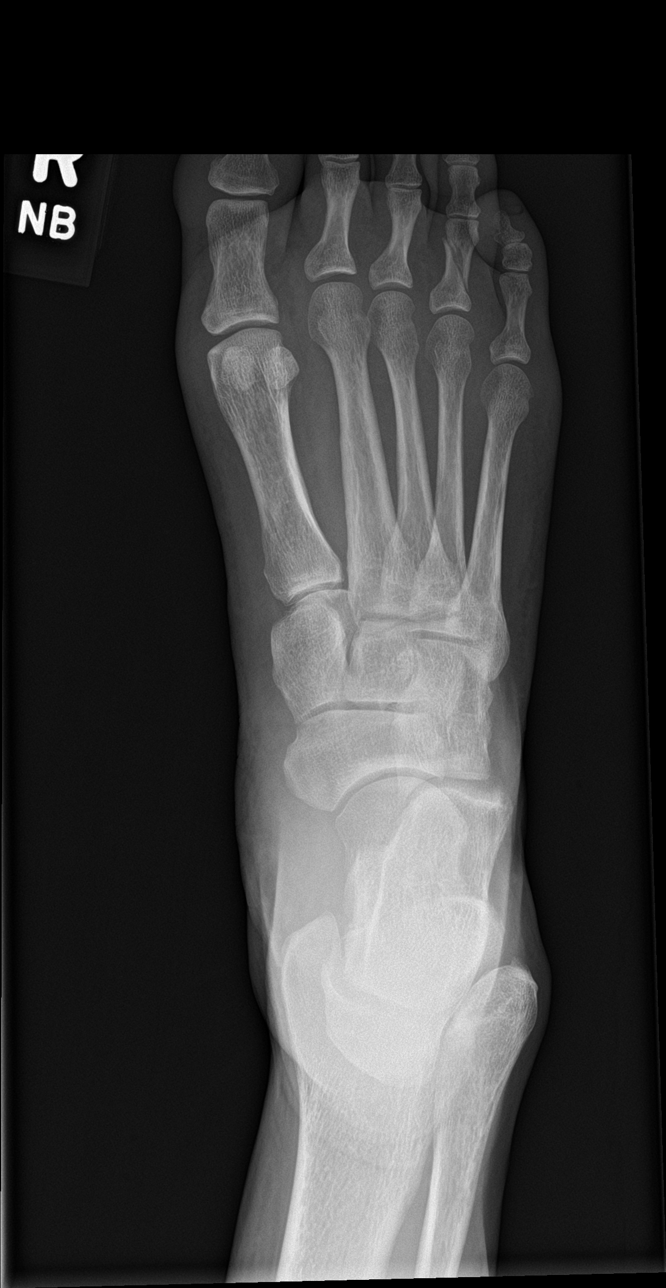

[5 of 5 positions shown; findings below may reference images not displayed]

FINDINGS: Minimally displaced oblique fracture of the fourth toe proximal
phalanx. Fracture does not have intra-articular involvement. No
other definite fractures. Prominent plantar calcaneal spur.
IMPRESSION: Minimally displaced fracture involving the fourth toe proximal
phalanx.
